# Patient Record
Sex: Male | Born: 1965
Health system: Southern US, Community
[De-identification: ages and names within clinical notes are randomized; demographics above are authoritative.]

## PROBLEM LIST (undated history)

## (undated) ENCOUNTER — Ambulatory Visit (HOSPITAL_COMMUNITY): Admission: EM | Payer: Medicare HMO

## (undated) DIAGNOSIS — L309 Dermatitis, unspecified: Secondary | ICD-10-CM

## (undated) DIAGNOSIS — M109 Gout, unspecified: Secondary | ICD-10-CM

---

## 1997-12-22 ENCOUNTER — Emergency Department (HOSPITAL_COMMUNITY): Admission: EM | Admit: 1997-12-22 | Discharge: 1997-12-22 | Payer: Self-pay | Admitting: Emergency Medicine

## 1997-12-22 ENCOUNTER — Encounter: Payer: Self-pay | Admitting: Emergency Medicine

## 2002-06-10 ENCOUNTER — Emergency Department (HOSPITAL_COMMUNITY): Admission: EM | Admit: 2002-06-10 | Discharge: 2002-06-10 | Payer: Self-pay | Admitting: Emergency Medicine

## 2004-01-27 ENCOUNTER — Ambulatory Visit: Payer: Self-pay | Admitting: *Deleted

## 2004-03-09 ENCOUNTER — Ambulatory Visit: Payer: Self-pay | Admitting: Family Medicine

## 2004-03-09 ENCOUNTER — Ambulatory Visit (HOSPITAL_COMMUNITY): Admission: RE | Admit: 2004-03-09 | Discharge: 2004-03-09 | Payer: Self-pay | Admitting: Internal Medicine

## 2004-03-31 ENCOUNTER — Ambulatory Visit: Payer: Self-pay | Admitting: Family Medicine

## 2005-07-22 ENCOUNTER — Ambulatory Visit: Payer: Self-pay | Admitting: Family Medicine

## 2006-11-03 ENCOUNTER — Ambulatory Visit: Payer: Self-pay | Admitting: Internal Medicine

## 2006-11-16 ENCOUNTER — Encounter (INDEPENDENT_AMBULATORY_CARE_PROVIDER_SITE_OTHER): Payer: Self-pay | Admitting: *Deleted

## 2007-12-26 ENCOUNTER — Emergency Department (HOSPITAL_COMMUNITY): Admission: EM | Admit: 2007-12-26 | Discharge: 2007-12-26 | Payer: Self-pay | Admitting: Family Medicine

## 2008-07-24 ENCOUNTER — Ambulatory Visit: Payer: Self-pay | Admitting: Internal Medicine

## 2008-08-28 ENCOUNTER — Ambulatory Visit: Payer: Self-pay | Admitting: Internal Medicine

## 2008-11-26 ENCOUNTER — Telehealth (INDEPENDENT_AMBULATORY_CARE_PROVIDER_SITE_OTHER): Payer: Self-pay | Admitting: *Deleted

## 2008-12-13 ENCOUNTER — Telehealth (INDEPENDENT_AMBULATORY_CARE_PROVIDER_SITE_OTHER): Payer: Self-pay | Admitting: *Deleted

## 2008-12-16 ENCOUNTER — Ambulatory Visit: Payer: Self-pay | Admitting: Adult Health

## 2009-05-02 ENCOUNTER — Ambulatory Visit: Payer: Self-pay | Admitting: Internal Medicine

## 2009-08-12 ENCOUNTER — Emergency Department (HOSPITAL_COMMUNITY): Admission: EM | Admit: 2009-08-12 | Discharge: 2009-08-12 | Payer: Self-pay | Admitting: Emergency Medicine

## 2009-10-19 ENCOUNTER — Emergency Department (HOSPITAL_COMMUNITY): Admission: EM | Admit: 2009-10-19 | Discharge: 2009-10-19 | Payer: Self-pay | Admitting: Emergency Medicine

## 2010-05-15 LAB — DIFFERENTIAL
Basophils Absolute: 0 10*3/uL (ref 0.0–0.1)
Basophils Relative: 0 % (ref 0–1)
Eosinophils Relative: 2 % (ref 0–5)
Monocytes Absolute: 0.8 10*3/uL (ref 0.1–1.0)
Monocytes Relative: 8 % (ref 3–12)

## 2010-05-15 LAB — CBC
HCT: 39.6 % (ref 39.0–52.0)
Hemoglobin: 13.8 g/dL (ref 13.0–17.0)
MCH: 32.9 pg (ref 26.0–34.0)
MCHC: 34.8 g/dL (ref 30.0–36.0)
MCV: 94.5 fL (ref 78.0–100.0)
RDW: 14 % (ref 11.5–15.5)

## 2010-05-15 LAB — BASIC METABOLIC PANEL
BUN: 16 mg/dL (ref 6–23)
CO2: 24 mEq/L (ref 19–32)
Calcium: 9 mg/dL (ref 8.4–10.5)
Chloride: 107 mEq/L (ref 96–112)
Creatinine, Ser: 1.03 mg/dL (ref 0.4–1.5)
GFR calc Af Amer: >60 mL/min (ref >60)
GFR calc non Af Amer: >60 mL/min (ref >60)
Glucose, Bld: 139 mg/dL — ABNORMAL HIGH (ref 70–99)
Potassium: 3.8 mEq/L (ref 3.5–5.1)
Sodium: 138 mEq/L (ref 135–145)

## 2010-05-16 ENCOUNTER — Encounter (INDEPENDENT_AMBULATORY_CARE_PROVIDER_SITE_OTHER): Payer: Self-pay | Admitting: *Deleted

## 2010-05-16 LAB — CONVERTED CEMR LAB
Albumin: 4.2 g/dL (ref 3.5–5.2)
Alkaline Phosphatase: 91 units/L (ref 39–117)
BUN: 12 mg/dL (ref 6–23)
CO2: 23 meq/L (ref 19–32)
Glucose, Bld: 86 mg/dL (ref 70–99)
Potassium: 3.8 meq/L (ref 3.5–5.3)
Sodium: 138 meq/L (ref 135–145)
Total Bilirubin: 0.4 mg/dL (ref 0.3–1.2)
Total Protein: 7.3 g/dL (ref 6.0–8.3)

## 2010-05-18 LAB — URINALYSIS, ROUTINE W REFLEX MICROSCOPIC
Bilirubin Urine: NEGATIVE
Glucose, UA: NEGATIVE mg/dL
Hgb urine dipstick: NEGATIVE
Protein, ur: NEGATIVE mg/dL
Urobilinogen, UA: 0.2 mg/dL (ref 0.0–1.0)

## 2011-11-20 ENCOUNTER — Encounter (HOSPITAL_COMMUNITY): Payer: Self-pay | Admitting: Emergency Medicine

## 2011-11-20 ENCOUNTER — Emergency Department (HOSPITAL_COMMUNITY)
Admission: EM | Admit: 2011-11-20 | Discharge: 2011-11-20 | Disposition: A | Discharge status: Home / Self Care | Payer: Self-pay | Source: Home / Self Care

## 2011-11-20 DIAGNOSIS — M109 Gout, unspecified: Secondary | ICD-10-CM

## 2011-11-20 HISTORY — DX: Dermatitis, unspecified: L30.9

## 2011-11-20 HISTORY — DX: Gout, unspecified: M10.9

## 2011-11-20 MED ORDER — TRIAMCINOLONE ACETONIDE 40 MG/ML IJ SUSP
INTRAMUSCULAR | Status: AC
  Filled 2011-11-20: qty 5

## 2011-11-20 MED ORDER — COLCHICINE 0.6 MG PO TABS: ORAL_TABLET | ORAL | Status: DC

## 2011-11-20 MED ORDER — TRIAMCINOLONE ACETONIDE 40 MG/ML IJ SUSP
60.0000 mg | Freq: Once | INTRAMUSCULAR | Status: AC
  Administered 2011-11-20: 60 mg via INTRAMUSCULAR

## 2011-11-20 MED ORDER — NAPROXEN 500 MG PO TABS: 500.0000 mg | ORAL_TABLET | Freq: Two times a day (BID) | ORAL | Status: DC

## 2011-11-20 NOTE — ED Notes (Signed)
Pt c/o gout on right knee x3 weeks... Sx include: pain, swelling, unable to bend at knee... Denies: fevers, vomiting, nausea, diarrhea... Also, has some gout pain on right toe and left toe.

## 2011-11-20 NOTE — ED Provider Notes (Signed)
History     CSN: [id number]  Arrival date & time 11/20/11  1402   None     Chief Complaint  Patient presents with  . Gout    (Consider location/radiation/quality/duration/timing/severity/associated sxs/prior treatment) HPI Comments: This is a 46 year old man with a history of gout previously exacerbated alcohol consumption. He states he stopped drinking on [birth year] which [birth year] his [birth year]. This most recent flareup occurred 2 and half weeks ago and is complaining of pain and inflammation in the right great toe, the right knee, and a" little bit in the left foot".  during the time of his pain he has been working at a [employer] site performing gaining squatting and other movements exacerbate the pain. One day recently after work he states his right knee just locked and he has sick neck pain with attempts to flex the knee. He was taken off daily call [employer] recently however he states that's what worse for him to best.   Past Medical History  Diagnosis Date  . Gout   . Eczema     History reviewed. No pertinent past surgical history.  No family history on file.  History  Substance Use Topics  . Smoking status: Current Every Day Smoker -- 1.0 packs/day    Types: Cigarettes  . Smokeless tobacco: Not on file  . Alcohol Use: Yes      Review of Systems  Constitutional: Negative.  Negative for fever, chills and diaphoresis.  Respiratory: Negative.   Gastrointestinal: Negative.   Genitourinary: Negative.   Musculoskeletal:       As per HPI  Skin: Negative.   Neurological: Negative for dizziness, weakness, numbness and headaches.    Allergies  Review of patient's allergies indicates no known allergies.  Home Medications   Current Outpatient Rx  Name Route Sig Dispense Refill  . COLCHICINE 0.6 MG PO TABS  2 tabs po x 1, then one tab po 1 hour later 8 tablet 0  . NAPROXEN 500 MG PO TABS Oral Take 1 tablet (500 mg total) by mouth 2 (two) times daily. Take with food 30  tablet 0    BP 168/102  Pulse 98  Temp 98.6 F (37 C) (Oral)  Resp 18  SpO2 96%  Physical Exam  Constitutional: He is oriented to person, place, and time. He appears well-developed and well-nourished.  HENT:  Head: Normocephalic and atraumatic.  Eyes: EOM are normal. Left eye exhibits no discharge.  Neck: Normal range of motion. Neck supple.  Musculoskeletal:       Marked tenderness over the right patella and knee. The most anterior aspect experienced this severe pain with light touch. There is also pain and tenderness in the right great toe however he refuses to take the shoe off because of the pain it causes. No erythema or lymphangitis, no increased warmth or deformity.  Neurological: He is alert and oriented to person, place, and time. No cranial nerve deficit.  Skin: Skin is warm and dry. No rash noted. No erythema.  Psychiatric: He has a normal mood and affect.    ED Course  Procedures (including critical care time)  Labs Reviewed - No data to display No results found.   1. Gout attack       MDM  Naprosyn 500 mg twice a day p.c.  Colcrys .6mg  2 now, repeat in 2 h, then 1 q d for 5 d. Decrease activity and weight bearing for a few days.        Hayden Rasmussen,  NP 11/20/11 1545

## 2011-11-21 NOTE — ED Provider Notes (Signed)
Medical screening examination/treatment/procedure(s) were performed by non-physician practitioner and as supervising physician I was immediately available for consultation/collaboration.  Leslee Home, M.D.   Reuben Likes, MD 11/21/11 [phone]

## 2012-01-26 ENCOUNTER — Encounter (HOSPITAL_COMMUNITY): Payer: Self-pay | Admitting: *Deleted

## 2012-01-26 ENCOUNTER — Emergency Department (HOSPITAL_COMMUNITY)
Admission: EM | Admit: 2012-01-26 | Discharge: 2012-01-26 | Disposition: A | Discharge status: Home / Self Care | Payer: Self-pay | Attending: Emergency Medicine | Admitting: Emergency Medicine

## 2012-01-26 DIAGNOSIS — M109 Gout, unspecified: Secondary | ICD-10-CM

## 2012-01-26 DIAGNOSIS — F17200 Nicotine dependence, unspecified, uncomplicated: Secondary | ICD-10-CM | POA: Insufficient documentation

## 2012-01-26 DIAGNOSIS — Z791 Long term (current) use of non-steroidal anti-inflammatories (NSAID): Secondary | ICD-10-CM | POA: Insufficient documentation

## 2012-01-26 DIAGNOSIS — Z79899 Other long term (current) drug therapy: Secondary | ICD-10-CM | POA: Insufficient documentation

## 2012-01-26 MED ORDER — HYDROCODONE-ACETAMINOPHEN 5-325 MG PO TABS
2.0000 | ORAL_TABLET | Freq: Once | ORAL | Status: AC
  Administered 2012-01-26: 2 via ORAL
  Filled 2012-01-26: qty 2

## 2012-01-26 MED ORDER — PREDNISONE 10 MG PO TABS: 40.0000 mg | ORAL_TABLET | Freq: Every day | ORAL | Status: DC

## 2012-01-26 MED ORDER — PREDNISONE 20 MG PO TABS
60.0000 mg | ORAL_TABLET | Freq: Once | ORAL | Status: AC
  Administered 2012-01-26: 60 mg via ORAL
  Filled 2012-01-26: qty 3

## 2012-01-26 MED ORDER — HYDROCODONE-ACETAMINOPHEN 5-500 MG PO TABS: 1.0000 | ORAL_TABLET | Freq: Four times a day (QID) | ORAL | Status: DC | PRN

## 2012-01-26 NOTE — ED Notes (Signed)
To ED for eval of left elbow pain. States he has known gout and was seen at [facility] 2 weeks ago. Ran out of meds. No injury noted.

## 2012-01-26 NOTE — ED Notes (Signed)
Patient states he only had 8 pills that were prescribed from [facility] apprx a month ago. He states his elbow flared up 3-4 days ago and has no meds or physician.

## 2012-01-26 NOTE — ED Provider Notes (Signed)
History   This chart was scribed for Doug Sou, MD by Toya Smothers, ED Scribe. The patient was seen in room TR11C/TR11C. Patient's care was started at 1318.  CSN: [id number]  Arrival date & time 01/26/12  1318   First MD Initiated Contact with Patient 01/26/12 1418      Chief Complaint  Patient presents with  . Elbow Pain    The history is provided by the patient. No language interpreter was used.    Bryce West is a 46 y.o. male with H/o gout who presents to the Emergency Department complaining of 3-4 days of left elbow pain and swelling. Pain is described similar to that of previous gout flare ups. Aggravated with palpation and extension of LUE. He reports onset 1 week after consumption of alcohol. Pt lists a 16 year h/o of recurrent gout flare ups, which are typically treated with Rx naoproxen 500 mg and Colchicine 0.6 MG. Has also been treated with prednisone in the past with success No fever, chills, cough, congestion, rhinorrhea, chest pain, SOB, or n/v/d. Symptoms have not been treated PTA. Medical Hx includes gout and eczema. No pertinent surgical Hx listed. Pt denies use of tobacco products and use of illicit drugs. Alcohol last consumed 3 weeks ago.   Past Medical History  Diagnosis Date  . Gout   . Eczema     History reviewed. No pertinent past surgical history.  History reviewed. No pertinent family history.  History  Substance Use Topics  . Smoking status: Current Every Day Smoker -- 1.0 packs/day    Types: Cigarettes  . Smokeless tobacco: Not on file  . Alcohol Use: Yes    Review of Systems  Constitutional: Negative.   HENT: Negative.   Respiratory: Negative.   Cardiovascular: Negative.   Gastrointestinal: Negative.   Musculoskeletal: Positive for joint swelling.  Skin: Negative.   Neurological: Negative.   Hematological: Negative.   Psychiatric/Behavioral: Negative.     Allergies  Review of patient's allergies indicates no known  allergies.  Home Medications   Current Outpatient Rx  Name  Route  Sig  Dispense  Refill  . COLCHICINE 0.6 MG PO TABS      2 tabs po x 1, then one tab po 1 hour later   8 tablet   0   . NAPROXEN 500 MG PO TABS   Oral   Take 1 tablet (500 mg total) by mouth 2 (two) times daily. Take with food   30 tablet   0     BP 154/91  Pulse 88  Temp 98.1 F (36.7 C) (Oral)  Resp 14  SpO2 97%  Physical Exam  Nursing note and vitals reviewed. Constitutional: He appears well-developed and well-nourished.  HENT:  Head: Normocephalic and atraumatic.  Eyes: Conjunctivae normal are normal. Pupils are equal, round, and reactive to light.  Neck: Neck supple. No tracheal deviation present. No thyromegaly present.  Cardiovascular: Normal rate and regular rhythm.   No murmur heard.       Radial pulse is 2+.  Pulmonary/Chest: Effort normal and breath sounds normal.  Abdominal: Soft. Bowel sounds are normal. He exhibits no distension. There is no tenderness.  Musculoskeletal: Normal range of motion. He exhibits no edema and no tenderness.       Unable to fully extend LUE. Elbow is slightly swollen and warm.  Neurological: He is alert. Coordination normal.  Skin: Skin is warm and dry. No rash noted.  Psychiatric: He has a normal mood and affect.  ED Course  Procedures DIAGNOSTIC STUDIES: Oxygen Saturation is 97% on room air, normal by my interpretation.    COORDINATION OF CARE: 14:19- Evaluated Pt. Pt is awake, alert, and without distress. Pt arrived via personal transport and will be driven home. 14:29- Patient informed of clinical course, understand medical decision-making process, and agree with plan.    Labs Reviewed - No data to display No results found.   No diagnosis found.    MDM  Plan prescriptions prednisone, Vicodin Followup [place] [facility] Blood pressure recheck 3 weeks Diagnosis #1 gouty arthropathy #2 elevated blood pressure       I  personally performed the services described in this documentation, which was scribed in my presence. The recorded information has been reviewed and is accurate.    Doug Sou, MD 01/26/12 [phone]

## 2012-03-15 ENCOUNTER — Encounter (HOSPITAL_COMMUNITY): Payer: Self-pay | Admitting: *Deleted

## 2012-03-15 ENCOUNTER — Emergency Department (INDEPENDENT_AMBULATORY_CARE_PROVIDER_SITE_OTHER)
Admission: EM | Admit: 2012-03-15 | Discharge: 2012-03-15 | Disposition: A | Discharge status: Home / Self Care | Payer: Self-pay | Source: Home / Self Care | Attending: Family Medicine | Admitting: Family Medicine

## 2012-03-15 DIAGNOSIS — M109 Gout, unspecified: Secondary | ICD-10-CM

## 2012-03-15 LAB — COMPREHENSIVE METABOLIC PANEL
ALT: 7 U/L (ref 0–53)
AST: 15 U/L (ref 0–37)
Albumin: 3.9 g/dL (ref 3.5–5.2)
Alkaline Phosphatase: 113 U/L (ref 39–117)
CO2: 26 mEq/L (ref 19–32)
Chloride: 100 mEq/L (ref 96–112)
GFR calc non Af Amer: >90 mL/min (ref >90)
Potassium: 3.7 mEq/L (ref 3.5–5.1)
Total Bilirubin: 0.4 mg/dL (ref 0.3–1.2)

## 2012-03-15 LAB — CBC
Hemoglobin: 14.9 g/dL (ref 13.0–17.0)
Platelets: 241 10*3/uL (ref 150–400)
RBC: 4.86 MIL/uL (ref 4.22–5.81)
WBC: 11.4 10*3/uL — ABNORMAL HIGH (ref 4.0–10.5)

## 2012-03-15 LAB — URIC ACID: Uric Acid, Serum: 6.8 mg/dL (ref 4.0–7.8)

## 2012-03-15 MED ORDER — PREDNISONE 20 MG PO TABS: 40.0000 mg | ORAL_TABLET | Freq: Every day | ORAL | Status: DC

## 2012-03-15 MED ORDER — HYDROCODONE-ACETAMINOPHEN 5-500 MG PO TABS: 1.0000 | ORAL_TABLET | Freq: Four times a day (QID) | ORAL | Status: DC | PRN

## 2012-03-15 MED ORDER — NAPROXEN 500 MG PO TABS: 500.0000 mg | ORAL_TABLET | Freq: Two times a day (BID) | ORAL | Status: DC

## 2012-03-15 NOTE — ED Notes (Signed)
Pt reports gout in right wrist - has run out of meds - pt reports no etoh for 100 days and then drank for 3 days= gout returning

## 2012-03-15 NOTE — ED Provider Notes (Signed)
History     CSN: [id number]  Arrival date & time 03/15/12  1126   First MD Initiated Contact with Patient 03/15/12 1152      Chief Complaint  Patient presents with  . Gout    (Consider location/radiation/quality/duration/timing/severity/associated sxs/prior treatment) HPI Comments: 47 year old male smoker with history of gout. Here complaining of right hand and right wrist pain for the last 3 days. Patient reports that he has been abstinent from alcohol for about 3 months but has been drinking in the last week which is making his gout flare up. States he's been off alcohol for the last 3 days since his pain started. Denies headaches, tremors or hallucinations. Denies shortness of breath, sweats or chest pain.  Patient states he ran out of gout medications and is currently without insurance. He used to be a [facility] patient [facility] the clinic is closed. He is not able to afford colchicine. Denies injury to his right upper extremity. Denies recent falls. Patient reports that usually his gout flares involved the right hand and right wrist and the left foot. Denies foot pain today. Has not had blood work in our records since 2011.   Past Medical History  Diagnosis Date  . Gout   . Eczema     No past surgical history on file.  Family History  Problem Relation Age of Onset  . Family history unknown: Yes    History  Substance Use Topics  . Smoking status: Current Every Day Smoker -- 1.0 packs/day    Types: Cigarettes  . Smokeless tobacco: Not on file  . Alcohol Use: Yes      Review of Systems  Constitutional: Negative for fever and chills.  Eyes: Negative for redness.  Respiratory: Negative for shortness of breath.   Cardiovascular: Negative for chest pain.  Gastrointestinal: Negative for nausea, vomiting and abdominal pain.  Genitourinary: Negative for dysuria.  Musculoskeletal: Positive for arthralgias.       As per history of present illness  Skin: Negative for rash.    Neurological: Negative for dizziness and headaches.  All other systems reviewed and are negative.    Allergies  Review of patient's allergies indicates no known allergies.  Home Medications   Current Outpatient Rx  Name  Route  Sig  Dispense  Refill  . COLCHICINE 0.6 MG PO TABS      2 tabs po x 1, then one tab po 1 hour later   8 tablet   0   . HYDROCODONE-ACETAMINOPHEN 5-500 MG PO TABS   Oral   Take 1-2 tablets by mouth every 6 (six) hours as needed for pain.   15 tablet   0   . NAPROXEN 500 MG PO TABS   Oral   Take 1 tablet (500 mg total) by mouth 2 (two) times daily with a meal. Take with food   30 tablet   1   . PREDNISONE 20 MG PO TABS   Oral   Take 2 tablets (40 mg total) by mouth daily.   10 tablet   0     BP 144/90  Pulse 81  Temp 98.2 F (36.8 C)  Resp 16  SpO2 98%  Physical Exam  Nursing note and vitals reviewed. Constitutional: He is oriented to person, place, and time. He appears well-developed and well-nourished. No distress.  Eyes: Conjunctivae normal are normal. No scleral icterus.  Neck: No JVD present. No thyromegaly present.  Cardiovascular: Normal rate, regular rhythm and normal heart sounds.  Pulmonary/Chest: Breath sounds normal.  Musculoskeletal:       Wrist right: limited flexion and extension due to pain no obvious swelling or deformity. No erythema or increased temp. There is noted mild swelling and tenderness to palpation with no redness or increased temp at the second and third MP joint also in the right hand. No skin brakes or wounds.  Normal radial and ulnar pulses. Intact superficial sensation in right distal upper extremity.  Lymphadenopathy:    He has no cervical adenopathy.  Neurological: He is alert and oriented to person, place, and time.  Skin: No rash noted. He is not diaphoretic.    ED Course  Procedures (including critical care time)   Labs Reviewed  CBC  URIC ACID  COMPREHENSIVE METABOLIC PANEL   No  results found.   1. Gout attack       MDM  Prescribed prednisone, naproxen and Vicodin. Patient unable to afford colchicine. Checked CBC, uric acid and complete metabolic panel. Patient was asked to scheduled a followup appointment at the adult clinic in one or 2 weeks on his way out. Supportive care recommendations should prompt his return to medical attention discussed with patient and provided in writing.        Sharin Grave, MD 03/17/12 1036

## 2013-04-15 ENCOUNTER — Emergency Department (HOSPITAL_COMMUNITY)
Admission: EM | Admit: 2013-04-15 | Discharge: 2013-04-15 | Disposition: A | Discharge status: Home / Self Care | Payer: Self-pay | Attending: Emergency Medicine | Admitting: Emergency Medicine

## 2013-04-15 ENCOUNTER — Encounter (HOSPITAL_COMMUNITY): Payer: Self-pay | Admitting: Emergency Medicine

## 2013-04-15 DIAGNOSIS — F17200 Nicotine dependence, unspecified, uncomplicated: Secondary | ICD-10-CM | POA: Insufficient documentation

## 2013-04-15 DIAGNOSIS — Z791 Long term (current) use of non-steroidal anti-inflammatories (NSAID): Secondary | ICD-10-CM | POA: Insufficient documentation

## 2013-04-15 DIAGNOSIS — L259 Unspecified contact dermatitis, unspecified cause: Secondary | ICD-10-CM | POA: Insufficient documentation

## 2013-04-15 DIAGNOSIS — F1010 Alcohol abuse, uncomplicated: Secondary | ICD-10-CM | POA: Insufficient documentation

## 2013-04-15 DIAGNOSIS — I10 Essential (primary) hypertension: Secondary | ICD-10-CM | POA: Insufficient documentation

## 2013-04-15 DIAGNOSIS — M109 Gout, unspecified: Secondary | ICD-10-CM | POA: Insufficient documentation

## 2013-04-15 MED ORDER — OXYCODONE-ACETAMINOPHEN 5-325 MG PO TABS
1.0000 | ORAL_TABLET | Freq: Once | ORAL | Status: AC
  Administered 2013-04-15: 1 via ORAL
  Filled 2013-04-15: qty 1

## 2013-04-15 MED ORDER — NAPROXEN 250 MG PO TABS
500.0000 mg | ORAL_TABLET | Freq: Two times a day (BID) | ORAL | Status: DC
  Administered 2013-04-15: 500 mg via ORAL
  Filled 2013-04-15: qty 2

## 2013-04-15 MED ORDER — NAPROXEN 500 MG PO TABS: 500.0000 mg | ORAL_TABLET | Freq: Two times a day (BID) | ORAL | Status: DC

## 2013-04-15 MED ORDER — OXYCODONE-ACETAMINOPHEN 5-325 MG PO TABS: 1.0000 | ORAL_TABLET | Freq: Four times a day (QID) | ORAL | Status: DC | PRN

## 2013-04-15 NOTE — ED Provider Notes (Signed)
CSN: [id number]     Arrival date & time 04/15/13  1030 History   First MD Initiated Contact with Patient 04/15/13 1102     Chief Complaint  Patient presents with  . Gout    right hand     (Consider location/radiation/quality/duration/timing/severity/associated sxs/prior Treatment) HPI  This is a 48 year old male with history of gout who presents with right hand pain. Patient reports a one-week history of right hand pain and swelling. He states that he has a history of gout and it usually migrates to involve his ankles, knees, or his hands. He is out of medications.  Reports 10/10 pain and has not taken anything for the pain. He does not take any chronic medications including allopurinol.  Admits to drinking alcohol and eating red meat. Patient denies any fevers or systemic symptoms. Of note, patient noted to be hypertensive in triage at 184/99. He denies any headache, chest pain, shortness of breath. He states he does have a primary clinic that he can go to.  Past Medical History  Diagnosis Date  . Gout   . Eczema    History reviewed. No pertinent past surgical history. No family history on file. History  Substance Use Topics  . Smoking status: Current Every Day Smoker -- 1.00 packs/day    Types: Cigarettes  . Smokeless tobacco: Not on file  . Alcohol Use: Yes    Review of Systems  Constitutional: Negative.  Negative for fever.  Respiratory: Negative.  Negative for chest tightness and shortness of breath.   Cardiovascular: Negative.  Negative for chest pain.  Gastrointestinal: Negative.  Negative for abdominal pain.  Genitourinary: Negative.  Negative for dysuria.  Musculoskeletal: Positive for joint swelling. Negative for back pain.  Skin: Negative for color change.  Neurological: Negative for headaches.  All other systems reviewed and are negative.      Allergies  Vicodin  Home Medications   Current Outpatient Rx  Name  Route  Sig  Dispense  Refill  . calamine  lotion   Topical   Apply 1 application topically 2 (two) times daily as needed (Eczema on hands).         . naproxen sodium (ANAPROX) 220 MG tablet   Oral   Take 220 mg by mouth daily as needed (Pain).         Marland Kitchen. OVER THE COUNTER MEDICATION   Oral   Take 1-2 tablets by mouth daily as needed (Pain relief). Generic Pain Relief medication         . colchicine 0.6 MG tablet      2 tabs po x 1, then one tab po 1 hour later   8 tablet   0   . naproxen (NAPROSYN) 500 MG tablet   Oral   Take 1 tablet (500 mg total) by mouth 2 (two) times daily with a meal.   30 tablet   0   . oxyCODONE-acetaminophen (PERCOCET/ROXICET) 5-325 MG per tablet   Oral   Take 1 tablet by mouth every 6 (six) hours as needed for severe pain.   15 tablet   0    BP 174/93  Pulse 69  Temp(Src) 98.4 F (36.9 C) (Oral)  Resp 18  Ht 5\' 11"  (1.803 m)  Wt 146 lb 8 oz (66.452 kg)  BMI 20.44 kg/m2  SpO2 100% Physical Exam  Nursing note and vitals reviewed. Constitutional: He is oriented to person, place, and time. He appears well-developed and well-nourished. No distress.  HENT:  Head: Normocephalic and  atraumatic.  Mouth/Throat: Oropharynx is clear and moist.  Cardiovascular: Normal rate, regular rhythm and normal heart sounds.   No murmur heard. Pulmonary/Chest: Effort normal. No respiratory distress.  Abdominal: Soft. There is no tenderness.  Musculoskeletal:  Swelling and tenderness to palpation over the right second MCP joint, no significant erythema or warmth noted  Lymphadenopathy:    He has no cervical adenopathy.  Neurological: He is alert and oriented to person, place, and time.  Skin: Skin is warm and dry.  Psychiatric: He has a normal mood and affect.    ED Course  Procedures (including critical care time) Labs Review Labs Reviewed - No data to display Imaging Review No results found.  EKG Interpretation   None       MDM   Final diagnoses:  Gout  Hypertension     Patient presents with right hand pain consistent with his gout.  He is otherwise nontoxic-appearing. He was noted to be hypertensive.  He has no other complaints. Physical exam reveals tenderness to palpation of the right MCP joint, no overlying skin changes or erythema noted. Spaces appear systemically well and have low suspicion at this time for septic joint. Patient will be given naproxen and oxycodone. I reviewed patient's lab work from January 2015 which showed a normal creatinine. Patient will be referred back to his clinic for further blood pressure checks.  After history, exam, and medical workup I feel the patient has been appropriately medically screened and is safe for discharge home. Pertinent diagnoses were discussed with the patient. Patient was given return precautions.     Shon Baton, MD 04/15/13 [phone]

## 2013-04-15 NOTE — Discharge Instructions (Signed)
Gout °Gout is an inflammatory arthritis caused by a buildup of uric acid crystals in the joints. Uric acid is a chemical that is normally present in the blood. When the level of uric acid in the blood is too high it can form crystals that deposit in your joints and tissues. This causes joint redness, soreness, and swelling (inflammation). Repeat attacks are common. Over time, uric acid crystals can form into masses (tophi) near a joint, destroying bone and causing disfigurement. Gout is treatable and often preventable. °CAUSES  °The disease begins with elevated levels of uric acid in the blood. Uric acid is produced by your body when it breaks down a naturally found substance called purines. Certain foods you eat, such as meats and fish, contain high amounts of purines. Causes of an elevated uric acid level include: °· Being passed down from parent to child (heredity). °· Diseases that cause increased uric acid production (such as obesity, psoriasis, and certain cancers). °· Excessive alcohol use. °· Diet, especially diets rich in meat and seafood. °· Medicines, including certain cancer-fighting medicines (chemotherapy), water pills (diuretics), and aspirin. °· Chronic kidney disease. The kidneys are no longer able to remove uric acid well. °· Problems with metabolism. °Conditions strongly associated with gout include: °· Obesity. °· High blood pressure. °· High cholesterol. °· Diabetes. °Not everyone with elevated uric acid levels gets gout. It is not understood why some people get gout and others do not. Surgery, joint injury, and eating too much of certain foods are some of the factors that can lead to gout attacks. °SYMPTOMS  °· An attack of gout comes on quickly. It causes intense pain with redness, swelling, and warmth in a joint. °· Fever can occur. °· Often, only one joint is involved. Certain joints are more commonly involved: °· Base of the big toe. °· Knee. °· Ankle. °· Wrist. °· Finger. °Without  treatment, an attack usually goes away in a few days to weeks. Between attacks, you usually will not have symptoms, which is different from many other forms of arthritis. °DIAGNOSIS  °Your caregiver will suspect gout based on your symptoms and exam. In some cases, tests may be recommended. The tests may include: °· Blood tests. °· Urine tests. °· X-rays. °· Joint fluid exam. This exam requires a needle to remove fluid from the joint (arthrocentesis). Using a microscope, gout is confirmed when uric acid crystals are seen in the joint fluid. °TREATMENT  °There are two phases to gout treatment: treating the sudden onset (acute) attack and preventing attacks (prophylaxis). °· Treatment of an Acute Attack. °· Medicines are used. These include anti-inflammatory medicines or steroid medicines. °· An injection of steroid medicine into the affected joint is sometimes necessary. °· The painful joint is rested. Movement can worsen the arthritis. °· You may use warm or cold treatments on painful joints, depending which works best for you. °· Treatment to Prevent Attacks. °· If you suffer from frequent gout attacks, your caregiver may advise preventive medicine. These medicines are started after the acute attack subsides. These medicines either help your kidneys eliminate uric acid from your body or decrease your uric acid production. You may need to stay on these medicines for a very long time. °· The early phase of treatment with preventive medicine can be associated with an increase in acute gout attacks. For this reason, during the first few months of treatment, your caregiver may also advise you to take medicines usually used for acute gout treatment. Be sure you   understand your caregiver's directions. Your caregiver may make several adjustments to your medicine dose before these medicines are effective.  Discuss dietary treatment with your caregiver or dietitian. Alcohol and drinks high in sugar and fructose and foods  such as meat, poultry, and seafood can increase uric acid levels. Your caregiver or dietician can advise you on drinks and foods that should be limited. HOME CARE INSTRUCTIONS   Do not take aspirin to relieve pain. This raises uric acid levels.  Only take over-the-counter or prescription medicines for pain, discomfort, or fever as directed by your caregiver.  Rest the joint as much as possible. When in bed, keep sheets and blankets off painful areas.  Keep the affected joint raised (elevated).  Apply warm or cold treatments to painful joints. Use of warm or cold treatments depends on which works best for you.  Use crutches if the painful joint is in your leg.  Drink enough fluids to keep your urine clear or pale yellow. This helps your body get rid of uric acid. Limit alcohol, sugary drinks, and fructose drinks.  Follow your dietary instructions. Pay careful attention to the amount of protein you eat. Your daily diet should emphasize fruits, vegetables, whole grains, and fat-free or low-fat milk products. Discuss the use of coffee, vitamin C, and cherries with your caregiver or dietician. These may be helpful in lowering uric acid levels.  Maintain a healthy body weight. SEEK MEDICAL CARE IF:   You develop diarrhea, vomiting, or any side effects from medicines.  You do not feel better in 24 hours, or you are getting worse. SEEK IMMEDIATE MEDICAL CARE IF:   Your joint becomes suddenly more tender, and you have chills or a fever. MAKE SURE YOU:   Understand these instructions.  Will watch your condition.  Will get help right away if you are not doing well or get worse. Document Released: 02/13/2000 Document Revised: 06/12/2012 Document Reviewed: 09/29/2011 [facility] Patient Information 2014 [place], [place].  Arterial Hypertension Arterial hypertension (high blood pressure) is a condition of elevated pressure in your blood vessels. Hypertension over a long period of time is a risk  factor for strokes, heart attacks, and heart failure. It is also the leading cause of kidney (renal) failure.  CAUSES   In Adults -- Over 90% of all hypertension has no known cause. This is called essential or primary hypertension. In the other 10% of people with hypertension, the increase in blood pressure is caused by another disorder. This is called secondary hypertension. Important causes of secondary hypertension are:  Heavy alcohol use.  Obstructive sleep apnea.  Hyperaldosterosim (Conn's syndrome).  Steroid use.  Chronic kidney failure.  Hyperparathyroidism.  Medications.  Renal artery stenosis.  Pheochromocytoma.  Cushing's disease.  Coarctation of the aorta.  Scleroderma renal crisis.  Licorice (in excessive amounts).  Drugs (cocaine, methamphetamine). Your caregiver can explain any items above that apply to you.  In Children -- Secondary hypertension is more common and should always be considered.  Pregnancy -- Few women of childbearing age have high blood pressure. However, up to 10% of them develop hypertension of pregnancy. Generally, this will not harm the woman. It may be a sign of 3 complications of pregnancy: preeclampsia, HELLP syndrome, and eclampsia. Follow up and control with medication is necessary. SYMPTOMS   This condition normally does not produce any noticeable symptoms. It is usually found during a routine exam.  Malignant hypertension is a late problem of high blood pressure. It may have the following symptoms:  Headaches.  Blurred vision.  End-organ damage (this means your kidneys, heart, lungs, and other organs are being damaged).  Stressful situations can increase the blood pressure. If a person with normal blood pressure has their blood pressure go up while being seen by their caregiver, this is often termed "white coat hypertension." Its importance is not known. It may be related with eventually developing hypertension or complications  of hypertension.  Hypertension is often confused with mental tension, stress, and anxiety. DIAGNOSIS  The diagnosis is made by 3 separate blood pressure measurements. They are taken at least 1 week apart from each other. If there is organ damage from hypertension, the diagnosis may be made without repeat measurements. Hypertension is usually identified by having blood pressure readings:  Above 140/90 mmHg measured in both arms, at 3 separate times, over a couple weeks.  Over 130/80 mmHg should be considered a risk factor and may require treatment in patients with diabetes. Blood pressure readings over 120/80 mmHg are called "pre-hypertension" even in non-diabetic patients. To get a true blood pressure measurement, use the following guidelines. Be aware of the factors that can alter blood pressure readings.  Take measurements at least 1 hour after caffeine.  Take measurements 30 minutes after smoking and without any stress. This is another reason to quit smoking  it raises your blood pressure.  Use a proper cuff size. Ask your caregiver if you are not sure about your cuff size.  Most home blood pressure cuffs are automatic. They will measure systolic and diastolic pressures. The systolic pressure is the pressure reading at the start of sounds. Diastolic pressure is the pressure at which the sounds disappear. If you are elderly, measure pressures in multiple postures. Try sitting, lying or standing.  Sit at rest for a minimum of 5 minutes before taking measurements.  You should not be on any medications like decongestants. These are found in many cold medications.  Record your blood pressure readings and review them with your caregiver. If you have hypertension:  Your caregiver may do tests to be sure you do not have secondary hypertension (see "causes" above).  Your caregiver may also look for signs of metabolic syndrome. This is also called Syndrome X or Insulin Resistance Syndrome. You  may have this syndrome if you have type 2 diabetes, abdominal obesity, and abnormal blood lipids in addition to hypertension.  Your caregiver will take your medical and family history and perform a physical exam.  Diagnostic tests may include blood tests (for glucose, cholesterol, potassium, and kidney function), a urinalysis, or an EKG. Other tests may also be necessary depending on your condition. PREVENTION  There are important lifestyle issues that you can adopt to reduce your chance of developing hypertension:  Maintain a normal weight.  Limit the amount of salt (sodium) in your diet.  Exercise often.  Limit alcohol intake.  Get enough potassium in your diet. Discuss specific advice with your caregiver.  Follow a DASH diet (dietary approaches to stop hypertension). This diet is rich in fruits, vegetables, and low-fat dairy products, and avoids certain fats. PROGNOSIS  Essential hypertension cannot be cured. Lifestyle changes and medical treatment can lower blood pressure and reduce complications. The prognosis of secondary hypertension depends on the underlying cause. Many people whose hypertension is controlled with medicine or lifestyle changes can live a normal, healthy life.  RISKS AND COMPLICATIONS  While high blood pressure alone is not an illness, it often requires treatment due to its short- and long-term effects  on many organs. Hypertension increases your risk for:  CVAs or strokes (cerebrovascular accident).  Heart failure due to chronically high blood pressure (hypertensive cardiomyopathy).  Heart attack (myocardial infarction).  Damage to the retina (hypertensive retinopathy).  Kidney failure (hypertensive nephropathy). Your caregiver can explain list items above that apply to you. Treatment of hypertension can significantly reduce the risk of complications. TREATMENT   For overweight patients, weight loss and regular exercise are recommended. Physical fitness  lowers blood pressure.  Mild hypertension is usually treated with diet and exercise. A diet rich in fruits and vegetables, fat-free dairy products, and foods low in fat and salt (sodium) can help lower blood pressure. Decreasing salt intake decreases blood pressure in a 1/3 of people.  Stop smoking if you are a smoker. The steps above are highly effective in reducing blood pressure. While these actions are easy to suggest, they are difficult to achieve. Most patients with moderate or severe hypertension end up requiring medications to bring their blood pressure down to a normal level. There are several classes of medications for treatment. Blood pressure pills (antihypertensives) will lower blood pressure by their different actions. Lowering the blood pressure by 10 mmHg may decrease the risk of complications by as much as 25%. The goal of treatment is effective blood pressure control. This will reduce your risk for complications. Your caregiver will help you determine the best treatment for you according to your lifestyle. What is excellent treatment for one person, may not be for you. HOME CARE INSTRUCTIONS   Do not smoke.  Follow the lifestyle changes outlined in the "Prevention" section.  If you are on medications, follow the directions carefully. Blood pressure medications must be taken as prescribed. Skipping doses reduces their benefit. It also puts you at risk for problems.  Follow up with your caregiver, as directed.  If you are asked to monitor your blood pressure at home, follow the guidelines in the "Diagnosis" section above. SEEK MEDICAL CARE IF:   You think you are having medication side effects.  You have recurrent headaches or lightheadedness.  You have swelling in your ankles.  You have trouble with your vision. SEEK IMMEDIATE MEDICAL CARE IF:   You have sudden onset of chest pain or pressure, difficulty breathing, or other symptoms of a heart attack.  You have a  severe headache.  You have symptoms of a stroke (such as sudden weakness, difficulty speaking, difficulty walking). MAKE SURE YOU:   Understand these instructions.  Will watch your condition.  Will get help right away if you are not doing well or get worse. Document Released: 02/15/2005 Document Revised: 05/10/2011 Document Reviewed: 09/15/2006 [facility] Patient Information 2014 [place]

## 2013-04-15 NOTE — ED Notes (Signed)
Pt c/o right hand pain and swelling x 1 week. Pt has history of gout. Pt admits to drinking and eating red meats. Pt BP 184/99. Pt denies history of HTN.

## 2014-07-15 ENCOUNTER — Encounter (HOSPITAL_COMMUNITY): Payer: Self-pay | Admitting: Nurse Practitioner

## 2014-07-15 ENCOUNTER — Emergency Department (HOSPITAL_COMMUNITY)
Admission: EM | Admit: 2014-07-15 | Discharge: 2014-07-15 | Disposition: A | Discharge status: Home / Self Care | Payer: Self-pay | Attending: Emergency Medicine | Admitting: Emergency Medicine

## 2014-07-15 DIAGNOSIS — Z872 Personal history of diseases of the skin and subcutaneous tissue: Secondary | ICD-10-CM | POA: Insufficient documentation

## 2014-07-15 DIAGNOSIS — Z720 Tobacco use: Secondary | ICD-10-CM | POA: Insufficient documentation

## 2014-07-15 DIAGNOSIS — M109 Gout, unspecified: Secondary | ICD-10-CM | POA: Insufficient documentation

## 2014-07-15 DIAGNOSIS — M25562 Pain in left knee: Secondary | ICD-10-CM | POA: Insufficient documentation

## 2014-07-15 MED ORDER — NAPROXEN 250 MG PO TABS
500.0000 mg | ORAL_TABLET | Freq: Once | ORAL | Status: AC
  Administered 2014-07-15: 500 mg via ORAL
  Filled 2014-07-15: qty 2

## 2014-07-15 MED ORDER — COLCHICINE 0.6 MG PO TABS: ORAL_TABLET | ORAL | Status: DC

## 2014-07-15 MED ORDER — NAPROXEN 500 MG PO TABS: 500.0000 mg | ORAL_TABLET | Freq: Two times a day (BID) | ORAL | Status: DC

## 2014-07-15 MED ORDER — OXYCODONE-ACETAMINOPHEN 5-325 MG PO TABS: 1.0000 | ORAL_TABLET | Freq: Four times a day (QID) | ORAL | Status: AC | PRN

## 2014-07-15 NOTE — ED Provider Notes (Signed)
CSN: [id number]     Arrival date & time 07/15/14  1303 History   This chart was scribed for non-physician practitioner, Francee PiccoloJennifer Rutherford Alarie, working with Doug SouSam Jacubowitz, MD by Richarda Overlieichard Holland, ED Scribe. This patient was seen in room TR07C/TR07C and the patient's care was started at 2:44 PM.   Chief Complaint  Patient presents with  . Knee Pain  . Elbow Pain   The history is provided by the patient. No language interpreter was used.   HPI Comments: Bryce West is a 49 y.o. male with a history of gout who presents to the Emergency Department complaining of gradually worsening left knee and left elbow pain that started 2 weeks ago. Pt states that it feels similar to his prior gout flare ups. He reports that these locations are the typical locations he experiences his gout pain. He states that drinking and eating salty foods aggravates his pain. Pt states he is not on gout prevention medications but has taken naproxen 500mg  in the past for flare ups with relief. He denies fever or any tingling sensations.    Past Medical History  Diagnosis Date  . Gout   . Eczema    History reviewed. No pertinent past surgical history. History reviewed. No pertinent family history. History  Substance Use Topics  . Smoking status: Current Every Day Smoker -- 1.00 packs/day    Types: Cigarettes  . Smokeless tobacco: Not on file  . Alcohol Use: Yes    Review of Systems  Constitutional: Negative for fever.  Musculoskeletal: Positive for arthralgias.  All other systems reviewed and are negative.  Allergies  Vicodin  Home Medications   Prior to Admission medications   Medication Sig Start Date End Date Taking? Authorizing Provider  calamine lotion Apply 1 application topically 2 (two) times daily as needed (Eczema on hands).    Historical Provider, MD  colchicine 0.6 MG tablet 2 tabs po x 1, then one tab po 1 hour later 07/15/14   Francee PiccoloJennifer Alilah Mcmeans, PA-C  naproxen (NAPROSYN) 500 MG tablet Take  1 tablet (500 mg total) by mouth 2 (two) times daily with a meal. 07/15/14   Francee PiccoloJennifer Ailin Rochford, PA-C  naproxen sodium (ANAPROX) 220 MG tablet Take 220 mg by mouth daily as needed (Pain).    Historical Provider, MD  OVER THE COUNTER MEDICATION Take 1-2 tablets by mouth daily as needed (Pain relief). Generic Pain Relief medication    Historical Provider, MD  oxyCODONE-acetaminophen (PERCOCET/ROXICET) 5-325 MG per tablet Take 1 tablet by mouth every 6 (six) hours as needed for severe pain. 07/15/14   Karsynn Deweese, PA-C   BP 134/104 mmHg  Pulse 69  Temp(Src) 98.1 F (36.7 C) (Oral)  Resp 16  SpO2 100% Physical Exam  Constitutional: He is oriented to person, place, and time. He appears well-developed and well-nourished. No distress.  HENT:  Head: Normocephalic and atraumatic.  Right Ear: External ear normal.  Left Ear: External ear normal.  Nose: Nose normal.  Mouth/Throat: Oropharynx is clear and moist.  Eyes: Conjunctivae are normal.  Neck: Normal range of motion. Neck supple.  No nuchal rigidity.   Cardiovascular: Normal rate, regular rhythm, normal heart sounds and intact distal pulses.   Pulmonary/Chest: Effort normal and breath sounds normal. No respiratory distress.  Abdominal: Soft.  Musculoskeletal: Normal range of motion.       Left elbow: He exhibits swelling. He exhibits normal range of motion, no effusion, no deformity and no laceration. Tenderness found.       Left  knee: He exhibits swelling. He exhibits normal range of motion, no effusion and no deformity. Tenderness found.  No erythema or warmth to left elbow or left knee.   Neurological: He is alert and oriented to person, place, and time.  Skin: Skin is warm and dry. He is not diaphoretic.  Psychiatric: He has a normal mood and affect.  Nursing note and vitals reviewed.   ED Course  Procedures   Medications  naproxen (NAPROSYN) tablet 500 mg (500 mg Oral Given 07/15/14 1512)    DIAGNOSTIC STUDIES: Oxygen  Saturation is 99% on RA, normal by my interpretation.    COORDINATION OF CARE: 2:47 PM Discussed treatment plan with pt at bedside and pt agreed to plan.   Labs Review Labs Reviewed - No data to display  Imaging Review No results found.   EKG Interpretation None      MDM   Final diagnoses:  Acute gout of left elbow, unspecified cause  Acute gout of left knee, unspecified cause   Filed Vitals:   07/15/14 1435  BP: 134/104  Pulse: 69  Temp: 98.1 F (36.7 C)  Resp: 16   Afebrile, NAD, non-toxic appearing, AAOx4.  Patient presents with left elbow and knee pain consistent with his gout. He is otherwise nontoxic-appearing. He was noted to be hypertensive. He has no other complaints. Physical exam reveals tenderness to palpation of the left knee and elbow, no overlying skin changes or erythema noted. Spaces appear systemically well and have low suspicion at this time for septic joint. Patient will be given naproxen, colchicine, and oxycodone. Patient will be referred back to his clinic for further blood pressure checks.   I personally performed the services described in this documentation, which was scribed in my presence. The recorded information has been reviewed and is accurate.       Francee PiccoloJennifer Bryonna Sundby, PA-C 07/15/14 1627  Doug SouSam Jacubowitz, MD 07/15/14 [phone]

## 2014-07-15 NOTE — Discharge Instructions (Signed)
Please follow up with your primary care physician in 1-2 days. If you do not have one please call the [facility] number listed above. Please take pain medication and/or muscle relaxants as prescribed and as needed for pain. Please do not drive on narcotic pain medication or on muscle relaxants. Please read all discharge instructions and return precautions.  ° ° °Gout °Gout is an inflammatory arthritis caused by a buildup of uric acid crystals in the joints. Uric acid is a chemical that is normally present in the blood. When the level of uric acid in the blood is too high it can form crystals that deposit in your joints and tissues. This causes joint redness, soreness, and swelling (inflammation). Repeat attacks are common. Over time, uric acid crystals can form into masses (tophi) near a joint, destroying bone and causing disfigurement. Gout is treatable and often preventable. °CAUSES  °The disease begins with elevated levels of uric acid in the blood. Uric acid is produced by your body when it breaks down a naturally found substance called purines. Certain foods you eat, such as meats and fish, contain high amounts of purines. Causes of an elevated uric acid level include: °· Being passed down from parent to child (heredity). °· Diseases that cause increased uric acid production (such as obesity, psoriasis, and certain cancers). °· Excessive alcohol use. °· Diet, especially diets rich in meat and seafood. °· Medicines, including certain cancer-fighting medicines (chemotherapy), water pills (diuretics), and aspirin. °· Chronic kidney disease. The kidneys are no longer able to remove uric acid well. °· Problems with metabolism. °Conditions strongly associated with gout include: °· Obesity. °· High blood pressure. °· High cholesterol. °· Diabetes. °Not everyone with elevated uric acid levels gets gout. It is not understood why some people get gout and others do not. Surgery, joint injury, and  eating too much of certain foods are some of the factors that can lead to gout attacks. °SYMPTOMS  °· An attack of gout comes on quickly. It causes intense pain with redness, swelling, and warmth in a joint. °· Fever can occur. °· Often, only one joint is involved. Certain joints are more commonly involved: °¨ Base of the big toe. °¨ Knee. °¨ Ankle. °¨ Wrist. °¨ Finger. °Without treatment, an attack usually goes away in a few days to weeks. Between attacks, you usually will not have symptoms, which is different from many other forms of arthritis. °DIAGNOSIS  °Your caregiver will suspect gout based on your symptoms and exam. In some cases, tests may be recommended. The tests may include: °· Blood tests. °· Urine tests. °· X-rays. °· Joint fluid exam. This exam requires a needle to remove fluid from the joint (arthrocentesis). Using a microscope, gout is confirmed when uric acid crystals are seen in the joint fluid. °TREATMENT  °There are two phases to gout treatment: treating the sudden onset (acute) attack and preventing attacks (prophylaxis). °· Treatment of an Acute Attack. °¨ Medicines are used. These include anti-inflammatory medicines or steroid medicines. °¨ An injection of steroid medicine into the affected joint is sometimes necessary. °¨ The painful joint is rested. Movement can worsen the arthritis. °¨ You may use warm or cold treatments on painful joints, depending which works best for you. °· Treatment to Prevent Attacks. °¨ If you suffer from frequent gout attacks, your caregiver may advise preventive medicine. These medicines are started after the acute attack subsides. These medicines either help your kidneys eliminate uric acid from your body or   decrease your uric acid production. You may need to stay on these medicines for a very long time. °¨ The early phase of treatment with preventive medicine can be associated with an increase in acute gout attacks. For this reason, during the first few months  of treatment, your caregiver may also advise you to take medicines usually used for acute gout treatment. Be sure you understand your caregiver's directions. Your caregiver may make several adjustments to your medicine dose before these medicines are effective. °¨ Discuss dietary treatment with your caregiver or dietitian. Alcohol and drinks high in sugar and fructose and foods such as meat, poultry, and seafood can increase uric acid levels. Your caregiver or dietitian can advise you on drinks and foods that should be limited. °HOME CARE INSTRUCTIONS  °· Do not take aspirin to relieve pain. This raises uric acid levels. °· Only take over-the-counter or prescription medicines for pain, discomfort, or fever as directed by your caregiver. °· Rest the joint as much as possible. When in bed, keep sheets and blankets off painful areas. °· Keep the affected joint raised (elevated). °· Apply warm or cold treatments to painful joints. Use of warm or cold treatments depends on which works best for you. °· Use crutches if the painful joint is in your leg. °· Drink enough fluids to keep your urine clear or pale yellow. This helps your body get rid of uric acid. Limit alcohol, sugary drinks, and fructose drinks. °· Follow your dietary instructions. Pay careful attention to the amount of protein you eat. Your daily diet should emphasize fruits, vegetables, whole grains, and fat-free or low-fat milk products. Discuss the use of coffee, vitamin C, and cherries with your caregiver or dietitian. These may be helpful in lowering uric acid levels. °· Maintain a healthy body weight. °SEEK MEDICAL CARE IF:  °· You develop diarrhea, vomiting, or any side effects from medicines. °· You do not feel better in 24 hours, or you are getting worse. °SEEK IMMEDIATE MEDICAL CARE IF:  °· Your joint becomes suddenly more tender, and you have chills or a fever. °MAKE SURE YOU:  °· Understand these instructions. °· Will watch your condition. °· Will  get help right away if you are not doing well or get worse. °Document Released: 02/13/2000 Document Revised: 07/02/2013 Document Reviewed: 09/29/2011 °ExitCare® Patient Information ©2015 [employer]. This information is not intended to replace advice given to you by your health care provider. Make sure you discuss any questions you have with your health care provider. ° °

## 2014-07-15 NOTE — ED Notes (Signed)
Declined W/C at D/C and was escorted to lobby by RN. 

## 2014-07-15 NOTE — ED Notes (Signed)
He c/o L knee and elbow pain this week. He feels like it is his gout. He is not on gout prevention meds. He tried aleve with some relief.

## 2014-07-17 NOTE — Progress Notes (Signed)
Bryce West called the office to advise that he was unable to fill his colchicine prescription due to cost. He did fill his other prescriptions. His wife was going to call the [facility] to have [facility] [place] [phone] with confirmation received.

## 2014-07-17 NOTE — Progress Notes (Addendum)
EDCM [facility] due to physicians NPI number does not match.  EDCM explained to pharmacist that when we [facility], we do not need physician NPI number.  EDCM checked spelling of PA Piepenbrink with pharmacist.  [facility] that [facility] with same perscriber.  [facility]  No further EDCM needs at this time.  07/17/2014 A. Doriann Zuch RNCM 1942pm [facility] received phone call from pharmacist Summer requesting another physician to order prescribe colchicine.  EDCM spoke to EDP Romeo AppleHarrison who [facility] and he provided [facility] number, prescription/MATCH letter then was approved.  No further EDCM needs at this time.

## 2014-09-24 ENCOUNTER — Emergency Department (HOSPITAL_COMMUNITY)
Admission: EM | Admit: 2014-09-24 | Discharge: 2014-09-24 | Disposition: A | Discharge status: Home / Self Care | Payer: Self-pay | Attending: Emergency Medicine | Admitting: Emergency Medicine

## 2014-09-24 ENCOUNTER — Encounter (HOSPITAL_COMMUNITY): Payer: Self-pay | Admitting: *Deleted

## 2014-09-24 DIAGNOSIS — Z720 Tobacco use: Secondary | ICD-10-CM | POA: Insufficient documentation

## 2014-09-24 DIAGNOSIS — G8929 Other chronic pain: Secondary | ICD-10-CM | POA: Insufficient documentation

## 2014-09-24 DIAGNOSIS — Z791 Long term (current) use of non-steroidal anti-inflammatories (NSAID): Secondary | ICD-10-CM | POA: Insufficient documentation

## 2014-09-24 DIAGNOSIS — M1A9XX1 Chronic gout, unspecified, with tophus (tophi): Secondary | ICD-10-CM

## 2014-09-24 DIAGNOSIS — Z872 Personal history of diseases of the skin and subcutaneous tissue: Secondary | ICD-10-CM | POA: Insufficient documentation

## 2014-09-24 MED ORDER — NAPROXEN 500 MG PO TABS: 500.0000 mg | ORAL_TABLET | Freq: Two times a day (BID) | ORAL | Status: DC

## 2014-09-24 MED ORDER — COLCHICINE 0.6 MG PO TABS
0.6000 mg | ORAL_TABLET | Freq: Once | ORAL | Status: AC
Start: 2014-09-24 — End: 2014-09-24
  Administered 2014-09-24: 0.6 mg via ORAL
  Filled 2014-09-24: qty 1

## 2014-09-24 MED ORDER — COLCHICINE 0.6 MG PO TABS: ORAL_TABLET | ORAL | Status: AC

## 2014-09-24 NOTE — ED Provider Notes (Signed)
CSN: [id number]     Arrival date & time 09/24/14  1646 History  This chart was scribed for non-physician practitioner, Fayrene Helper, PA-C working with Linwood Dibbles, MD by Gwenyth Ober, ED scribe. This patient was seen in room TR07C/TR07C and the patient's care was started at 5:56 PM   Chief Complaint  Patient presents with  . Gout   The history is provided by the patient.    HPI Comments: Bryce West is a 49 y.o. male with a history of gout who presents to the Emergency Department complaining of an acute-on-chronic episode of gout affecting his right wrist, right elbow and right fingers that started 4-5 days ago. He states sharp, throbbing and aching pain and chills as associated symptoms. Pt reports a history of similar flare-ups which have been treated with Colchicine. 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 Pt denies fever.  Pt does admit to eating red meat, drink alcohol and smoke.  He understand that it can aggravates his gout.    No PCP  Past Medical History  Diagnosis Date  . Gout   . Eczema    History reviewed. No pertinent past surgical history. No family history on file. History  Substance Use Topics  . Smoking status: Current Every Day Smoker -- 1.00 packs/day    Types: Cigarettes  . Smokeless tobacco: Not on file  . Alcohol Use: Yes    Review of Systems  Constitutional: Positive for chills. Negative for fever.  Musculoskeletal: Positive for joint swelling and arthralgias.   Allergies  Vicodin  Home Medications   Prior to Admission medications   Medication Sig Start Date End Date Taking? Authorizing Provider  calamine lotion Apply 1 application topically 2 (two) times daily as needed (Eczema on hands).    Historical Provider, MD  colchicine 0.6 MG tablet 2 tabs po x 1, then one tab po 1 hour later 07/15/14   Francee Piccolo, PA-C  naproxen (NAPROSYN) 500 MG tablet Take 1 tablet (500 mg total) by mouth 2 (two) times daily with a meal.  07/15/14   Francee Piccolo, PA-C  naproxen sodium (ANAPROX) 220 MG tablet Take 220 mg by mouth daily as needed (Pain).    Historical Provider, MD  OVER THE COUNTER MEDICATION Take 1-2 tablets by mouth daily as needed (Pain relief). Generic Pain Relief medication    Historical Provider, MD  oxyCODONE-acetaminophen (PERCOCET/ROXICET) 5-325 MG per tablet Take 1 tablet by mouth every 6 (six) hours as needed for severe pain. 07/15/14   Jennifer Piepenbrink, PA-C   BP 171/92 mmHg  Pulse 85  Temp(Src) 98.9 F (37.2 C) (Oral)  Resp 16  SpO2 98% Physical Exam  Constitutional: He appears well-developed and well-nourished. No distress.  HENT:  Head: Normocephalic and atraumatic.  Eyes: Conjunctivae and EOM are normal.  Neck: Neck supple. No tracheal deviation present.  Cardiovascular: Normal rate.   Pulmonary/Chest: Effort normal. No respiratory distress.  Musculoskeletal: He exhibits edema.  Right wrist: edematous, TTP, without crepitus or deformity; normal wrist flexion, extension, supination and protonation; Enlarged MCP joints in right hand involving 1st, 2nd, 3rd, 4th and 5th digits with evidence of tophaceous gout  Skin: Skin is warm and dry.  Psychiatric: He has a normal mood and affect. His behavior is normal.  Nursing note and vitals reviewed.   ED Course  Procedures   DIAGNOSTIC STUDIES: Oxygen Saturation is 98% on RA, normal by my interpretation.    COORDINATION OF CARE: 6:00 PM Symptoms consistent with tophaceous  gout. Pt has a history of the same. Advised pt to prevent gout by abstaining from alcohol, smoking and red meat. Discussed treatment plan with pt at bedside and pt agreed to plan.  Labs Review Labs Reviewed - No data to display  Imaging Review No results found.   EKG Interpretation None      MDM   Final diagnoses:  Tophaceous gout    BP 171/92 mmHg  Pulse 85  Temp(Src) 98.9 F (37.2 C) (Oral)  Resp 16  SpO2 98%   I personally performed the  services described in this documentation, which was scribed in my presence. The recorded information has been reviewed and is accurate.     Fayrene Helper, PA-C 09/24/14 1808  Linwood Dibbles, MD 09/24/14 2252

## 2014-09-24 NOTE — Discharge Instructions (Signed)
Gout Gout is an inflammatory arthritis caused by a buildup of uric acid crystals in the joints. Uric acid is a chemical that is normally present in the blood. When the level of uric acid in the blood is too high it can form crystals that deposit in your joints and tissues. This causes joint redness, soreness, and swelling (inflammation). Repeat attacks are common. Over time, uric acid crystals can form into masses (tophi) near a joint, destroying bone and causing disfigurement. Gout is treatable and often preventable. CAUSES  The disease begins with elevated levels of uric acid in the blood. Uric acid is produced by your body when it breaks down a naturally found substance called purines. Certain foods you eat, such as meats and fish, contain high amounts of purines. Causes of an elevated uric acid level include:  Being passed down from parent to child (heredity).  Diseases that cause increased uric acid production (such as obesity, psoriasis, and certain cancers).  Excessive alcohol use.  Diet, especially diets rich in meat and seafood.  Medicines, including certain cancer-fighting medicines (chemotherapy), water pills (diuretics), and aspirin.  Chronic kidney disease. The kidneys are no longer able to remove uric acid well.  Problems with metabolism. Conditions strongly associated with gout include:  Obesity.  High blood pressure.  High cholesterol.  Diabetes. Not everyone with elevated uric acid levels gets gout. It is not understood why some people get gout and others do not. Surgery, joint injury, and eating too much of certain foods are some of the factors that can lead to gout attacks. SYMPTOMS   An attack of gout comes on quickly. It causes intense pain with redness, swelling, and warmth in a joint.  Fever can occur.  Often, only one joint is involved. Certain joints are more commonly involved:  Base of the big toe.  Knee.  Ankle.  Wrist.  Finger. Without  treatment, an attack usually goes away in a few days to weeks. Between attacks, you usually will not have symptoms, which is different from many other forms of arthritis. DIAGNOSIS  Your caregiver will suspect gout based on your symptoms and exam. In some cases, tests may be recommended. The tests may include:  Blood tests.  Urine tests.  X-rays.  Joint fluid exam. This exam requires a needle to remove fluid from the joint (arthrocentesis). Using a microscope, gout is confirmed when uric acid crystals are seen in the joint fluid. TREATMENT  There are two phases to gout treatment: treating the sudden onset (acute) attack and preventing attacks (prophylaxis).  Treatment of an Acute Attack.  Medicines are used. These include anti-inflammatory medicines or steroid medicines.  An injection of steroid medicine into the affected joint is sometimes necessary.  The painful joint is rested. Movement can worsen the arthritis.  You may use warm or cold treatments on painful joints, depending which works best for you.  Treatment to Prevent Attacks.  If you suffer from frequent gout attacks, your caregiver may advise preventive medicine. These medicines are started after the acute attack subsides. These medicines either help your kidneys eliminate uric acid from your body or decrease your uric acid production. You may need to stay on these medicines for a very long time.  The early phase of treatment with preventive medicine can be associated with an increase in acute gout attacks. For this reason, during the first few months of treatment, your caregiver may also advise you to take medicines usually used for acute gout treatment. Be sure you   understand your caregiver's directions. Your caregiver may make several adjustments to your medicine dose before these medicines are effective.  Discuss dietary treatment with your caregiver or dietitian. Alcohol and drinks high in sugar and fructose and foods  such as meat, poultry, and seafood can increase uric acid levels. Your caregiver or dietitian can advise you on drinks and foods that should be limited. HOME CARE INSTRUCTIONS   Do not take aspirin to relieve pain. This raises uric acid levels.  Only take over-the-counter or prescription medicines for pain, discomfort, or fever as directed by your caregiver.  Rest the joint as much as possible. When in bed, keep sheets and blankets off painful areas.  Keep the affected joint raised (elevated).  Apply warm or cold treatments to painful joints. Use of warm or cold treatments depends on which works best for you.  Use crutches if the painful joint is in your leg.  Drink enough fluids to keep your urine clear or pale yellow. This helps your body get rid of uric acid. Limit alcohol, sugary drinks, and fructose drinks.  Follow your dietary instructions. Pay careful attention to the amount of protein you eat. Your daily diet should emphasize fruits, vegetables, whole grains, and fat-free or low-fat milk products. Discuss the use of coffee, vitamin C, and cherries with your caregiver or dietitian. These may be helpful in lowering uric acid levels.  Maintain a healthy body weight. SEEK MEDICAL CARE IF:   You develop diarrhea, vomiting, or any side effects from medicines.  You do not feel better in 24 hours, or you are getting worse. SEEK IMMEDIATE MEDICAL CARE IF:   Your joint becomes suddenly more tender, and you have chills or a fever. MAKE SURE YOU:   Understand these instructions.  Will watch your condition.  Will get help right away if you are not doing well or get worse. Document Released: 02/13/2000 Document Revised: 07/02/2013 Document Reviewed: 09/29/2011 ExitCare Patient Information 2015 [employer]. This information is not intended to replace advice given to you by your health care provider. Make sure you discuss any questions you have with your health care  provider. Low-Purine Diet Purines are compounds that affect the level of uric acid in your body. A low-purine diet is a diet that is low in purines. Eating a low-purine diet can prevent the level of uric acid in your body from getting too high and causing gout or kidney stones or both. WHAT DO I NEED TO KNOW ABOUT THIS DIET?  Choose low-purine foods. Examples of low-purine foods are listed in the next section.  Drink plenty of fluids, especially water. Fluids can help remove uric acid from your body. Try to drink 8-16 cups (1.9-3.8 L) a day.  Limit foods high in fat, especially saturated fat, as fat makes it harder for the body to get rid of uric acid. Foods high in saturated fat include pizza, cheese, ice cream, whole milk, fried foods, and gravies. Choose foods that are lower in fat and lean sources of protein. Use olive oil when cooking as it contains healthy fats that are not high in saturated fat.  Limit alcohol. Alcohol interferes with the elimination of uric acid from your body. If you are having a gout attack, avoid all alcohol.  Keep in mind that different people's bodies react differently to different foods. You will probably learn over time which foods do or do not affect you. If you discover that a food tends to cause your gout to flare up,   avoid eating that food. You can more freely enjoy foods that do not cause problems. If you have any questions about a food item, talk to your dietitian or health care provider. WHICH FOODS ARE LOW, MODERATE, AND HIGH IN PURINES? The following is a list of foods that are low, moderate, and high in purines. You can eat any amount of the foods that are low in purines. You may be able to have small amounts of foods that are moderate in purines. Ask your health care provider how much of a food moderate in purines you can have. Avoid foods high in purines. Grains  Foods low in purines: Enriched white bread, pasta, rice, cake, cornbread, popcorn.  Foods  moderate in purines: Whole-grain breads and cereals, wheat germ, bran, oatmeal. Uncooked oatmeal. Dry wheat bran or wheat germ.  Foods high in purines: Pancakes, French toast, biscuits, muffins. Vegetables  Foods low in purines: All vegetables, except those that are moderate in purines.  Foods moderate in purines: Asparagus, cauliflower, spinach, mushrooms, green peas. Fruits  All fruits are low in purines. Meats and other Protein Foods  Foods low in purines: Eggs, nuts, peanut butter.  Foods moderate in purines: 80-90% lean beef, lamb, veal, pork, poultry, fish, eggs, peanut butter, nuts. Crab, lobster, oysters, and shrimp. Cooked dried beans, peas, and lentils.  Foods high in purines: Anchovies, sardines, herring, mussels, tuna, codfish, scallops, trout, and haddock. Bacon. Organ meats (such as liver or kidney). Tripe. Game meat. Goose. Sweetbreads. Dairy  All dairy foods are low in purines. Low-fat and fat-free dairy products are best because they are low in saturated fat. Beverages  Drinks low in purines: Water, carbonated beverages, tea, coffee, cocoa.  Drinks moderate in purines: Soft drinks and other drinks sweetened with high-fructose corn syrup. Juices. To find whether a food or drink is sweetened with high-fructose corn syrup, look at the ingredients list.  Drinks high in purines: Alcoholic beverages (such as beer). Condiments  Foods low in purines: Salt, herbs, olives, pickles, relishes, vinegar.  Foods moderate in purines: Butter, margarine, oils, mayonnaise. Fats and Oils  Foods low in purines: All types, except gravies and sauces made with meat.  Foods high in purines: Gravies and sauces made with meat. Other Foods  Foods low in purines: Sugars, sweets, gelatin. Cake. Soups made without meat.  Foods moderate in purines: Meat-based or fish-based soups, broths, or bouillons. Foods and drinks sweetened with high-fructose corn syrup.  Foods high in purines:  High-fat desserts (such as ice cream, cookies, cakes, pies, doughnuts, and chocolate). Contact your dietitian for more information on foods that are not listed here. Document Released: 06/12/2010 Document Revised: 02/20/2013 Document Reviewed: 01/22/2013 ExitCare Patient Information 2015 [employer]. This information is not intended to replace advice given to you by your health care provider. Make sure you discuss any questions you have with your health care provider.  

## 2014-09-24 NOTE — ED Notes (Signed)
Pt states that he has gout in his right wrist. States that he has ran out of his prescriptions. States he needs colchicine to help with the swelling.

## 2014-11-04 ENCOUNTER — Emergency Department (HOSPITAL_COMMUNITY)
Admission: EM | Admit: 2014-11-04 | Discharge: 2014-11-04 | Disposition: A | Discharge status: Home / Self Care | Payer: Self-pay | Attending: Emergency Medicine | Admitting: Emergency Medicine

## 2014-11-04 ENCOUNTER — Encounter (HOSPITAL_COMMUNITY): Payer: Self-pay | Admitting: Emergency Medicine

## 2014-11-04 DIAGNOSIS — M109 Gout, unspecified: Secondary | ICD-10-CM | POA: Insufficient documentation

## 2014-11-04 DIAGNOSIS — Z79899 Other long term (current) drug therapy: Secondary | ICD-10-CM | POA: Insufficient documentation

## 2014-11-04 DIAGNOSIS — Z720 Tobacco use: Secondary | ICD-10-CM | POA: Insufficient documentation

## 2014-11-04 DIAGNOSIS — L0291 Cutaneous abscess, unspecified: Secondary | ICD-10-CM

## 2014-11-04 DIAGNOSIS — L0211 Cutaneous abscess of neck: Secondary | ICD-10-CM | POA: Insufficient documentation

## 2014-11-04 MED ORDER — SULFAMETHOXAZOLE-TRIMETHOPRIM 800-160 MG PO TABS: 1.0000 | ORAL_TABLET | Freq: Two times a day (BID) | ORAL | Status: AC

## 2014-11-04 MED ORDER — LIDOCAINE HCL (PF) 1 % IJ SOLN
5.0000 mL | Freq: Once | INTRAMUSCULAR | Status: AC
  Administered 2014-11-04: 5 mL
  Filled 2014-11-04: qty 5

## 2014-11-04 NOTE — ED Provider Notes (Signed)
CSN: [id number]     Arrival date & time 11/04/14  1200 History  This chart was scribed for non-physician practitioner, Roxy Horseman, PA-C working with Tilden Fossa, MD by Doreatha Martin, ED scribe. This patient was seen in room TR09C/TR09C and the patient's care was started at 12:14 PM    Chief Complaint  Patient presents with  . Abscess   The history is provided by the patient. No language interpreter was used.    HPI Comments: Bryce West is a 49 y.o. male who presents to the Emergency Department with a chief complaint of a moderate, gradually worsening area of pain and swelling behind the right earlobe onset 2 days ago. Pt states he believes a spider bit him, but did not see a spider or feel a specific bite. He states his wife has attempted to drain the area twice and he has applied neosporin and rubbing alcohol PTA. No Hx of DM. NKDA. He denies fever.     Past Medical History  Diagnosis Date  . Gout   . Eczema    History reviewed. No pertinent past surgical history. No family history on file. Social History  Substance Use Topics  . Smoking status: Current Every Day Smoker -- 1.00 packs/day    Types: Cigarettes  . Smokeless tobacco: None  . Alcohol Use: Yes    Review of Systems  Constitutional: Negative for fever.  Skin: Positive for wound ( area of pain and swelling behind the right earlobe ).   Allergies  Vicodin  Home Medications   Prior to Admission medications   Medication Sig Start Date End Date Taking? Authorizing Provider  calamine lotion Apply 1 application topically 2 (two) times daily as needed (Eczema on hands).    Historical Provider, MD  colchicine 0.6 MG tablet 2 tabs po x 1, then one tab po 1 hour later 09/24/14   Fayrene Helper, PA-C  naproxen (NAPROSYN) 500 MG tablet Take 1 tablet (500 mg total) by mouth 2 (two) times daily with a meal. 09/24/14   Fayrene Helper, PA-C  naproxen sodium (ANAPROX) 220 MG tablet Take 220 mg by mouth daily as needed (Pain).     Historical Provider, MD  OVER THE COUNTER MEDICATION Take 1-2 tablets by mouth daily as needed (Pain relief). Generic Pain Relief medication    Historical Provider, MD  oxyCODONE-acetaminophen (PERCOCET/ROXICET) 5-325 MG per tablet Take 1 tablet by mouth every 6 (six) hours as needed for severe pain. 07/15/14   Jennifer Piepenbrink, PA-C   BP 166/87 mmHg  Pulse 78  Temp(Src) 98.5 F (36.9 C) (Oral)  Resp 16  Ht  (1.778 m)  Wt 140 lb 4.8 oz (63.64 kg)  BMI 20.13 kg/m2  SpO2 100% Physical Exam  Constitutional: He is oriented to person, place, and time. He appears well-developed and well-nourished.  HENT:  Head: Normocephalic and atraumatic.  1 cm fluctuant abscess on right lateral neck adjacent to earlobe, no mastoid tenderness, ear canal is clear, tympanic membrane is normal  Eyes: Conjunctivae and EOM are normal. Pupils are equal, round, and reactive to light.  Neck: Normal range of motion. Neck supple.  Cardiovascular: Normal rate.   Pulmonary/Chest: Effort normal. No respiratory distress.  Abdominal: He exhibits no distension.  Musculoskeletal: Normal range of motion.  Neurological: He is alert and oriented to person, place, and time.  Skin: Skin is warm and dry.  No evidence of cellulitis  Psychiatric: He has a normal mood and affect. His behavior is normal.  Nursing  note and vitals reviewed.   ED Course  Procedures (including critical care time) DIAGNOSTIC STUDIES: Oxygen Saturation is 100% on RA, normal by my interpretation.    COORDINATION OF CARE: 12:16 PM Discussed treatment plan with pt at bedside and pt agreed to plan.   12:30 PM INCISION AND DRAINAGE  Performed by: Roxy Horseman, PA-C Authorized by: Roxy Horseman, PA-C Consent - Verbal Consent obtained Risks and benefits: risks/benefits and alternatives were discussed  Type: Abscess  Body Area: Right posterior neck adjacent to earlobe  Anesthesia: Local infiltration Local anesthetic: lidocaine 2%  without epinephrine  Anesthetic total: 1ml  Complexity: Complex  Blunt dissection to break up loculations  Drainage: Purulent  Drainage amount: 2 ml aspirated with 18-gauge needle  Packing material: not packed  Patient tolerance: Patient tolerated the procedure well with no immediate complications   MDM   Final diagnoses:  Abscess    Patient with skin abscess amenable to incision and drainage.  Abscess was not large enough to warrant packing or drain,  wound recheck in 2 days. Encouraged home warm soaks and flushing.  Mild signs of cellulitis is surrounding skin.  Will d/c to home.    I personally performed the services described in this documentation, which was scribed in my presence. The recorded information has been reviewed and is accurate.    Roxy Horseman, PA-C 11/04/14 1300  Tilden Fossa, MD 11/04/14 [phone]

## 2014-11-04 NOTE — ED Notes (Signed)
Declined W/C at D/C and was escorted to lobby by RN. 

## 2014-11-04 NOTE — ED Notes (Signed)
Lidocaine and I&D Tray at the bedside.

## 2014-11-04 NOTE — ED Notes (Signed)
Pt from home for eval of possible abscess to right ear lob x2 days, pt denies any n/v/d or fevers. No drainage noted at this time. Pt denies any sob or neck tenderness just tenderness to back of ear. nad noted at this time.

## 2014-11-04 NOTE — Discharge Instructions (Signed)

## 2016-08-26 NOTE — Congregational Nurse Program (Signed)
Congregational Nurse Program Note  Date of Encounter: 08/02/2016  Past Medical History: Past Medical History:  Diagnosis Date  . Eczema   . Gout     Encounter Details:     CNP Questionnaire - 08/02/16 1932      Patient Demographics   Is this a new or existing patient? New   Patient is considered a/an Not Applicable   Race African-American/Black     Patient Assistance   Location of Patient Assistance Not Applicable   Patient's financial/insurance status Low Income;Self-Pay (Uninsured)   Uninsured Patient ([employer], trade [employer]Card/Care Connects) Yes   Interventions Not Applicable   Patient referred to apply for the following financial assistance Not Applicable   Food insecurities addressed Not Applicable   Transportation assistance Yes   Type of Assistance Bus Pass Given   Assistance securing medications No   Educational health offerings Behavioral health;Spiritual care     Encounter Details   Primary purpose of visit Chronic Illness/Condition Visit;Education/Health Concerns;Safety;Spiritual Care/Support Visit;Navigating the Healthcare System   Was an Emergency Department visit averted? Not Applicable   Does patient have a medical provider? Yes   Patient referred to Area Agency   Was a mental health screening completed? (GAINS tool) No   Does patient have dental issues? No   Does patient have vision issues? No   Does your patient have an abnormal blood pressure today? No   Since previous encounter, have you referred patient for abnormal blood pressure that resulted in a new diagnosis or medication change? No   Does your patient have an abnormal blood glucose today? No   Since previous encounter, have you referred patient for abnormal blood glucose that resulted in a new diagnosis or medication change? No   Was there a life-saving intervention made? No      States relasped after being "clean" for over a year.  Tearful, requesting help.  Referred to [facility].   Provided support and encouragement.  Bus passes given

## 2017-04-11 ENCOUNTER — Other Ambulatory Visit: Payer: Self-pay

## 2017-04-11 ENCOUNTER — Encounter (HOSPITAL_COMMUNITY): Payer: Self-pay | Admitting: Emergency Medicine

## 2017-04-11 DIAGNOSIS — R0789 Other chest pain: Secondary | ICD-10-CM | POA: Insufficient documentation

## 2017-04-11 DIAGNOSIS — F17210 Nicotine dependence, cigarettes, uncomplicated: Secondary | ICD-10-CM | POA: Insufficient documentation

## 2017-04-11 NOTE — ED Triage Notes (Signed)
Pt c/o bilateral 10/10 cp for the past 2 weeks getting worse today with some SOB, no nausea, vomiting fever or dizziness.

## 2017-04-12 ENCOUNTER — Emergency Department (HOSPITAL_COMMUNITY): Payer: Self-pay

## 2017-04-12 ENCOUNTER — Emergency Department (HOSPITAL_COMMUNITY)
Admission: EM | Admit: 2017-04-12 | Discharge: 2017-04-12 | Disposition: A | Discharge status: Home / Self Care | Payer: Self-pay | Attending: Emergency Medicine | Admitting: Emergency Medicine

## 2017-04-12 DIAGNOSIS — R0789 Other chest pain: Secondary | ICD-10-CM

## 2017-04-12 LAB — BASIC METABOLIC PANEL
Anion gap: 12 (ref 5–15)
BUN: 10 mg/dL (ref 6–20)
CALCIUM: 9 mg/dL (ref 8.9–10.3)
CO2: 24 mmol/L (ref 22–32)
CREATININE: 0.85 mg/dL (ref 0.61–1.24)
Chloride: 99 mmol/L — ABNORMAL LOW (ref 101–111)
GFR calc Af Amer: >60 mL/min (ref >60)
GFR calc non Af Amer: >60 mL/min (ref >60)
Glucose, Bld: 113 mg/dL — ABNORMAL HIGH (ref 65–99)
Potassium: 3.2 mmol/L — ABNORMAL LOW (ref 3.5–5.1)
Sodium: 135 mmol/L (ref 135–145)

## 2017-04-12 LAB — CBC
HCT: 37.3 % — ABNORMAL LOW (ref 39.0–52.0)
Hemoglobin: 12.7 g/dL — ABNORMAL LOW (ref 13.0–17.0)
MCH: 31.8 pg (ref 26.0–34.0)
MCHC: 34 g/dL (ref 30.0–36.0)
MCV: 93.3 fL (ref 78.0–100.0)
Platelets: 311 10*3/uL (ref 150–400)
RBC: 4 MIL/uL — ABNORMAL LOW (ref 4.22–5.81)
RDW: 14.3 % (ref 11.5–15.5)
WBC: 17.4 10*3/uL — ABNORMAL HIGH (ref 4.0–10.5)

## 2017-04-12 LAB — I-STAT TROPONIN, ED
TROPONIN I, POC: 0 ng/mL (ref 0.00–0.08)
Troponin i, poc: 0 ng/mL (ref 0.00–0.08)

## 2017-04-12 LAB — D-DIMER, QUANTITATIVE (NOT AT ARMC): D DIMER QUANT: 3.17 ug{FEU}/mL — AB (ref 0.00–0.50)

## 2017-04-12 MED ORDER — KETOROLAC TROMETHAMINE 30 MG/ML IJ SOLN
30.0000 mg | Freq: Once | INTRAMUSCULAR | Status: AC
  Administered 2017-04-12: 30 mg via INTRAVENOUS
  Filled 2017-04-12: qty 1

## 2017-04-12 MED ORDER — NAPROXEN 500 MG PO TABS: 500.0000 mg | ORAL_TABLET | Freq: Two times a day (BID) | ORAL | 0 refills | Status: AC

## 2017-04-12 MED ORDER — IOPAMIDOL (ISOVUE-370) INJECTION 76%
INTRAVENOUS | Status: AC
  Administered 2017-04-12: 100 mL
  Filled 2017-04-12: qty 100

## 2017-04-12 NOTE — ED Notes (Signed)
Patient ambulatory to exit; e-sign scanner not working - verbalized understanding

## 2017-04-12 NOTE — Discharge Instructions (Signed)
There is no evidence of heart attack or blood clot in the lung. Follow up with your doctor. Return to the ED if you develop new or worsening symptoms.  °

## 2017-04-12 NOTE — ED Provider Notes (Signed)
[facility] EMERGENCY DEPARTMENT Provider Note   CSN: [id number] Arrival date & time: 04/11/17  2348     History   Chief Complaint Chief Complaint  Patient presents with  . Chest Pain    HPI Bryce West is a 52 y.o. male.  Patient presents with a 2-day (contrary to triage note) history of bilateral rib pain that is worse with coughing and deep breathing.  He denies any falls or injury.  He states the pain is constant but worse when he moves his body and twists his torso or has deep breathing or coughing.  This is associated with some shortness of breath.  Patient states he had a URI last week and was taking cough medications.  He is not clear whether he strained something from coughing.  Denies fever, chills, nausea or vomiting.  No diaphoresis.  Denies any cardiac history.  History of hypertension and gout.  Denies any cocaine use.  He stopped smoking 2 days ago.  He has been taking naproxen for the site pain without relief.  The pain is not exertional and never goes away completely.   The history is provided by the patient.  Chest Pain   Associated symptoms include cough and shortness of breath. Pertinent negatives include no abdominal pain, no dizziness, no fever, no headaches, no nausea, no palpitations, no vomiting and no weakness.    Past Medical History:  Diagnosis Date  . Eczema   . Gout     There are no active problems to display for this patient.   History reviewed. No pertinent surgical history.     Home Medications    Prior to Admission medications   Medication Sig Start Date End Date Taking? Authorizing Provider  calamine lotion Apply 1 application topically 2 (two) times daily as needed (Eczema on hands).    [provider]  colchicine 0.6 MG tablet 2 tabs po x 1, then one tab po 1 hour later 09/24/14   Fayrene Helper, PA-C  naproxen (NAPROSYN) 500 MG tablet Take 1 tablet (500 mg total) by mouth 2 (two) times daily with a meal.  09/24/14   Fayrene Helper, PA-C  naproxen sodium (ANAPROX) 220 MG tablet Take 220 mg by mouth daily as needed (Pain).    [provider]  OVER THE COUNTER MEDICATION Take 1-2 tablets by mouth daily as needed (Pain relief). Generic Pain Relief medication    [provider]  oxyCODONE-acetaminophen (PERCOCET/ROXICET) 5-325 MG per tablet Take 1 tablet by mouth every 6 (six) hours as needed for severe pain. 07/15/14   Piepenbrink, Victorino Dike, PA-C    Family History No family history on file.  Social History Social History   Tobacco Use  . Smoking status: Current Every Day Smoker    Packs/day: 1.00    Types: Cigarettes  Substance Use Topics  . Alcohol use: Yes  . Drug use: Yes    Types: Marijuana    Comment: daily     Allergies   Vicodin [hydrocodone-acetaminophen]   Review of Systems Review of Systems  Constitutional: Negative for activity change, appetite change and fever.  HENT: Negative for congestion and rhinorrhea.   Eyes: Negative for visual disturbance.  Respiratory: Positive for cough, chest tightness and shortness of breath.   Cardiovascular: Positive for chest pain. Negative for palpitations.  Gastrointestinal: Negative for abdominal pain, nausea and vomiting.  Genitourinary: Negative for dysuria, hematuria, testicular pain and urgency.  Musculoskeletal: Negative for arthralgias and myalgias.  Neurological: Negative for dizziness,  weakness and headaches.   all other systems are negative except as noted in the HPI and PMH.     Physical Exam Updated Vital Signs BP (!) 164/80   Pulse 88   Temp 98.4 F (36.9 C) (Oral)   Resp 20   Ht 5\' 11"  (1.803 m)   Wt 63.5 kg (140 lb)   SpO2 99%   BMI 19.53 kg/m   Physical Exam  Constitutional: He is oriented to person, place, and time. He appears well-developed and well-nourished. No distress.  HENT:  Head: Normocephalic and atraumatic.  Mouth/Throat: Oropharynx is clear and moist. No oropharyngeal exudate.   Eyes: Conjunctivae and EOM are normal. Pupils are equal, round, and reactive to light.  Neck: Normal range of motion. Neck supple.  No meningismus.  Cardiovascular: Normal rate, regular rhythm, normal heart sounds and intact distal pulses.  No murmur heard. Pulmonary/Chest: Effort normal and breath sounds normal. No respiratory distress. He exhibits no tenderness.  Lungs are clear there is no pain with palpation.  Pain is worse with twisting of patient's torso, coughing and deep breathing  Abdominal: Soft. There is no tenderness. There is no rebound and no guarding.  Musculoskeletal: Normal range of motion. He exhibits no edema or tenderness.  Neurological: He is alert and oriented to person, place, and time. No cranial nerve deficit. He exhibits normal muscle tone. Coordination normal.  No ataxia on finger to nose bilaterally. No pronator drift. 5/5 strength throughout. CN 2-12 intact.Equal grip strength. Sensation intact.   Skin: Skin is warm.  Psychiatric: He has a normal mood and affect. His behavior is normal.  Nursing note and vitals reviewed.    ED Treatments / Results  Labs (all labs ordered are listed, but only abnormal results are displayed) Labs Reviewed  BASIC METABOLIC PANEL - Abnormal; Notable for the following components:      Result Value   Potassium 3.2 (*)    Chloride 99 (*)    Glucose, Bld 113 (*)    All other components within normal limits  CBC - Abnormal; Notable for the following components:   WBC 17.4 (*)    RBC 4.00 (*)    Hemoglobin 12.7 (*)    HCT 37.3 (*)    All other components within normal limits  D-DIMER, QUANTITATIVE (NOT AT [facility])  I-STAT TROPONIN, ED  I-STAT TROPONIN, ED    EKG  EKG Interpretation  Date/Time:  Monday April 11 2017 23:52:21 EST Ventricular Rate:  97 PR Interval:  120 QRS Duration: 90 QT Interval:  348 QTC Calculation: 441 R Axis:   69 Text Interpretation:  Normal sinus rhythm Left ventricular hypertrophy with  repolarization abnormality Abnormal ECG new T wave changes inferiorly Confirmed by Glynn Octave [phone]) on 04/12/2017 2:54:33 AM       Radiology Dg Chest 2 View  Result Date: 04/12/2017 CLINICAL DATA:  Chest pain for the past 2 weeks worsening today with dyspnea. EXAM: CHEST  2 VIEW COMPARISON:  CT 10/19/2009 FINDINGS: The heart size and mediastinal contours are within normal limits. Left lower lobe scarring is redemonstrated. No pneumonic consolidation or CHF. No effusion or pneumothorax. The visualized skeletal structures are unremarkable. IMPRESSION: No active cardiopulmonary disease. Electronically Signed   By: Tollie Eth M.D.   On: 04/12/2017 00:11   Ct Angio Chest Pe W And/or Wo Contrast  Result Date: 04/12/2017 CLINICAL DATA:  Subacute onset of bilateral chest pain and shortness of breath. EXAM: CT ANGIOGRAPHY CHEST WITH CONTRAST TECHNIQUE: Multidetector CT imaging of  the chest was performed using the standard protocol during bolus administration of intravenous contrast. Multiplanar CT image reconstructions and MIPs were obtained to evaluate the vascular anatomy. CONTRAST:  100mL ISOVUE-370 IOPAMIDOL (ISOVUE-370) INJECTION 76% COMPARISON:  Chest radiograph performed earlier today at 12:03 a.m., and CTA of the chest performed 10/19/2009 FINDINGS: Cardiovascular: There is no evidence of aortic dissection. There is no evidence of aneurysmal dilatation. Scattered calcification is seen along the thoracic aorta. The great vessels are unremarkable in appearance. No significant pulmonary embolus is seen. The heart is normal in size. Mediastinum/Nodes: The mediastinum is unremarkable in appearance. No mediastinal lymphadenopathy is seen. No pericardial effusion is identified. The visualized portions of the thyroid gland are unremarkable. No axillary lymphadenopathy is seen. Lungs/Pleura: Minimal bilateral atelectasis is noted. No pleural effusion or pneumothorax is seen. No masses are identified. Upper  Abdomen: The visualized portions of the liver and spleen are unremarkable. The visualized portions of the gallbladder, pancreas, adrenal glands and kidneys are within normal limits. Musculoskeletal: No acute osseous abnormalities are identified. The visualized musculature is unremarkable in appearance. Review of the MIP images confirms the above findings. IMPRESSION: 1. No evidence of aortic dissection. No evidence of aneurysmal dilatation. 2. No evidence of significant pulmonary embolus. 3. Minimal bilateral atelectasis.  Lungs otherwise clear. Electronically Signed   By: Roanna RaiderJeffery  Chang M.D.   On: 04/12/2017 04:58    Procedures Procedures (including critical care time)  Medications Ordered in ED Medications  ketorolac (TORADOL) 30 MG/ML injection 30 mg (not administered)     Initial Impression / Assessment and Plan / ED Course  I have reviewed the triage vital signs and the nursing notes.  Pertinent labs & imaging results that were available during my care of the patient were reviewed by me and considered in my medical decision making (see chart for details).    2 days of bilateral rib pain that is worse with movement and deep breathing and coughing.  EKG shows LVH with new T wave changes.  Chest x-ray is negative.  Troponin negative.  D-dimer is positive, subsequent CT PE is negative.  Troponin negative x2.  Pain improved with Toradol.  Low suspicion for ACS or PE.  EKG changes likely due to LVH.  Suspect musculoskeletal pain from coughing episodes last week. Follow-up with PCP.  Return precautions discussed.  Final Clinical Impressions(s) / ED Diagnoses   Final diagnoses:  Atypical chest pain    ED Discharge Orders    None       Jhace Fennell, Jeannett SeniorStephen, MD 04/12/17 [phone]

## 2019-01-19 ENCOUNTER — Other Ambulatory Visit: Payer: Self-pay | Admitting: Urology

## 2019-01-19 ENCOUNTER — Other Ambulatory Visit: Payer: Self-pay | Admitting: Nurse Practitioner

## 2019-01-19 DIAGNOSIS — F1010 Alcohol abuse, uncomplicated: Secondary | ICD-10-CM

## 2019-01-19 DIAGNOSIS — R1011 Right upper quadrant pain: Secondary | ICD-10-CM

## 2019-01-22 ENCOUNTER — Other Ambulatory Visit: Payer: Self-pay | Admitting: Nurse Practitioner

## 2019-01-24 ENCOUNTER — Ambulatory Visit
Admission: RE | Admit: 2019-01-24 | Discharge: 2019-01-24 | Disposition: A | Discharge status: Home / Self Care | Payer: BLUE CROSS/BLUE SHIELD | Source: Ambulatory Visit | Attending: Nurse Practitioner | Admitting: Nurse Practitioner

## 2019-01-24 DIAGNOSIS — F1010 Alcohol abuse, uncomplicated: Secondary | ICD-10-CM

## 2019-01-24 DIAGNOSIS — R1011 Right upper quadrant pain: Secondary | ICD-10-CM

## 2019-01-24 MED ORDER — IOPAMIDOL (ISOVUE-300) INJECTION 61%
100.0000 mL | Freq: Once | INTRAVENOUS | Status: AC | PRN
  Administered 2019-01-24: 100 mL via INTRAVENOUS

## 2019-01-31 ENCOUNTER — Other Ambulatory Visit: Payer: Self-pay

## 2020-03-26 DIAGNOSIS — M25512 Pain in left shoulder: Secondary | ICD-10-CM | POA: Diagnosis not present

## 2020-03-26 DIAGNOSIS — F1010 Alcohol abuse, uncomplicated: Secondary | ICD-10-CM | POA: Diagnosis not present

## 2020-03-26 DIAGNOSIS — M25511 Pain in right shoulder: Secondary | ICD-10-CM | POA: Diagnosis not present

## 2020-03-26 DIAGNOSIS — F1210 Cannabis abuse, uncomplicated: Secondary | ICD-10-CM | POA: Diagnosis not present

## 2020-03-26 DIAGNOSIS — M109 Gout, unspecified: Secondary | ICD-10-CM | POA: Diagnosis not present

## 2020-03-26 DIAGNOSIS — Z125 Encounter for screening for malignant neoplasm of prostate: Secondary | ICD-10-CM | POA: Diagnosis not present

## 2020-03-26 DIAGNOSIS — I10 Essential (primary) hypertension: Secondary | ICD-10-CM | POA: Diagnosis not present

## 2020-03-26 DIAGNOSIS — Z7189 Other specified counseling: Secondary | ICD-10-CM | POA: Diagnosis not present

## 2020-03-26 DIAGNOSIS — R17 Unspecified jaundice: Secondary | ICD-10-CM | POA: Diagnosis not present

## 2020-04-02 DIAGNOSIS — Z7189 Other specified counseling: Secondary | ICD-10-CM | POA: Diagnosis not present

## 2020-04-02 DIAGNOSIS — Z0000 Encounter for general adult medical examination without abnormal findings: Secondary | ICD-10-CM | POA: Diagnosis not present

## 2020-04-02 DIAGNOSIS — L209 Atopic dermatitis, unspecified: Secondary | ICD-10-CM | POA: Diagnosis not present

## 2020-04-02 DIAGNOSIS — I10 Essential (primary) hypertension: Secondary | ICD-10-CM | POA: Diagnosis not present

## 2020-04-16 DIAGNOSIS — I10 Essential (primary) hypertension: Secondary | ICD-10-CM | POA: Diagnosis not present

## 2020-04-16 DIAGNOSIS — Z7189 Other specified counseling: Secondary | ICD-10-CM | POA: Diagnosis not present

## 2020-04-16 DIAGNOSIS — L2082 Flexural eczema: Secondary | ICD-10-CM | POA: Diagnosis not present

## 2020-04-16 DIAGNOSIS — F1210 Cannabis abuse, uncomplicated: Secondary | ICD-10-CM | POA: Diagnosis not present

## 2020-04-16 DIAGNOSIS — F17210 Nicotine dependence, cigarettes, uncomplicated: Secondary | ICD-10-CM | POA: Diagnosis not present

## 2020-04-16 DIAGNOSIS — F1010 Alcohol abuse, uncomplicated: Secondary | ICD-10-CM | POA: Diagnosis not present

## 2020-05-07 DIAGNOSIS — Z7189 Other specified counseling: Secondary | ICD-10-CM | POA: Diagnosis not present

## 2020-05-07 DIAGNOSIS — J302 Other seasonal allergic rhinitis: Secondary | ICD-10-CM | POA: Diagnosis not present

## 2020-05-07 DIAGNOSIS — I10 Essential (primary) hypertension: Secondary | ICD-10-CM | POA: Diagnosis not present

## 2020-05-07 DIAGNOSIS — F1210 Cannabis abuse, uncomplicated: Secondary | ICD-10-CM | POA: Diagnosis not present

## 2020-05-07 DIAGNOSIS — F17210 Nicotine dependence, cigarettes, uncomplicated: Secondary | ICD-10-CM | POA: Diagnosis not present

## 2020-10-16 DIAGNOSIS — F1010 Alcohol abuse, uncomplicated: Secondary | ICD-10-CM | POA: Diagnosis not present

## 2020-10-16 DIAGNOSIS — J302 Other seasonal allergic rhinitis: Secondary | ICD-10-CM | POA: Diagnosis not present

## 2020-10-16 DIAGNOSIS — H7210 Attic perforation of tympanic membrane, unspecified ear: Secondary | ICD-10-CM | POA: Diagnosis not present

## 2020-10-16 DIAGNOSIS — F1210 Cannabis abuse, uncomplicated: Secondary | ICD-10-CM | POA: Diagnosis not present

## 2020-10-16 DIAGNOSIS — I10 Essential (primary) hypertension: Secondary | ICD-10-CM | POA: Diagnosis not present

## 2020-10-16 DIAGNOSIS — F17210 Nicotine dependence, cigarettes, uncomplicated: Secondary | ICD-10-CM | POA: Diagnosis not present

## 2020-10-16 DIAGNOSIS — R062 Wheezing: Secondary | ICD-10-CM | POA: Diagnosis not present

## 2020-10-30 DIAGNOSIS — M109 Gout, unspecified: Secondary | ICD-10-CM | POA: Diagnosis not present

## 2020-10-30 DIAGNOSIS — I10 Essential (primary) hypertension: Secondary | ICD-10-CM | POA: Diagnosis not present

## 2020-10-30 DIAGNOSIS — F1210 Cannabis abuse, uncomplicated: Secondary | ICD-10-CM | POA: Diagnosis not present

## 2020-10-30 DIAGNOSIS — F17210 Nicotine dependence, cigarettes, uncomplicated: Secondary | ICD-10-CM | POA: Diagnosis not present

## 2020-10-30 DIAGNOSIS — F1010 Alcohol abuse, uncomplicated: Secondary | ICD-10-CM | POA: Diagnosis not present

## 2020-10-30 DIAGNOSIS — R634 Abnormal weight loss: Secondary | ICD-10-CM | POA: Diagnosis not present

## 2020-11-03 ENCOUNTER — Emergency Department (HOSPITAL_COMMUNITY)
Admission: EM | Admit: 2020-11-03 | Discharge: 2020-11-03 | Disposition: A | Discharge status: Home / Self Care | Payer: Medicare HMO | Attending: Emergency Medicine | Admitting: Emergency Medicine

## 2020-11-03 ENCOUNTER — Other Ambulatory Visit: Payer: Self-pay

## 2020-11-03 ENCOUNTER — Emergency Department (HOSPITAL_COMMUNITY): Payer: Medicare HMO

## 2020-11-03 DIAGNOSIS — F17210 Nicotine dependence, cigarettes, uncomplicated: Secondary | ICD-10-CM | POA: Diagnosis not present

## 2020-11-03 DIAGNOSIS — S99922A Unspecified injury of left foot, initial encounter: Secondary | ICD-10-CM | POA: Diagnosis not present

## 2020-11-03 DIAGNOSIS — M79675 Pain in left toe(s): Secondary | ICD-10-CM | POA: Diagnosis not present

## 2020-11-03 DIAGNOSIS — W208XXA Other cause of strike by thrown, projected or falling object, initial encounter: Secondary | ICD-10-CM | POA: Insufficient documentation

## 2020-11-03 DIAGNOSIS — M21612 Bunion of left foot: Secondary | ICD-10-CM | POA: Diagnosis not present

## 2020-11-03 DIAGNOSIS — M2012 Hallux valgus (acquired), left foot: Secondary | ICD-10-CM | POA: Diagnosis not present

## 2020-11-03 DIAGNOSIS — S90112A Contusion of left great toe without damage to nail, initial encounter: Secondary | ICD-10-CM | POA: Insufficient documentation

## 2020-11-03 DIAGNOSIS — M19072 Primary osteoarthritis, left ankle and foot: Secondary | ICD-10-CM | POA: Diagnosis not present

## 2020-11-03 NOTE — Discharge Instructions (Addendum)
You were seen in the emergency department today for pain in your left big toe. The x-ray we took showed no fracture or dislocation of your toe. You can take tylenol and ibuprofen as needed for pain. Please return to the emergency department for any reason. You can also follow-up with your primary care doctor if the pain does not resolve. Thank you for trusting [place] with your care.

## 2020-11-03 NOTE — ED Provider Notes (Signed)
[facility] EMERGENCY DEPARTMENT Provider Note   CSN: [id number] Arrival date & time: 11/03/20  2131     History Chief Complaint  Patient presents with   Toe Injury    Bryce West is a 55 y.o. male.  With past medical history of gout who presents emergency department after rolling over in left great toe with his grill.  States that he was moving the grill when he struck his afternoon with it.  He denies hitting any other part of his foot.  Denies hearing any pop or crack.  Denies falling.   HPI  Past Medical History:  Diagnosis Date   Eczema    Gout     There are no problems to display for this patient.   No past surgical history on file.     No family history on file.  Social History   Tobacco Use   Smoking status: Every Day    Packs/day: 1.00    Types: Cigarettes  Substance Use Topics   Alcohol use: Yes   Drug use: Yes    Types: Marijuana    Comment: daily    Home Medications Prior to Admission medications   Medication Sig Start Date End Date Taking? Authorizing Provider  calamine lotion Apply 1 application topically 2 (two) times daily as needed (Eczema on hands).    [provider]  colchicine 0.6 MG tablet 2 tabs po x 1, then one tab po 1 hour later 09/24/14   Fayrene Helper, PA-C  naproxen (NAPROSYN) 500 MG tablet Take 1 tablet (500 mg total) by mouth 2 (two) times daily. 04/12/17   Rancour, Jeannett Senior, MD  naproxen sodium (ANAPROX) 220 MG tablet Take 220 mg by mouth daily as needed (Pain).    [provider]  OVER THE COUNTER MEDICATION Take 1-2 tablets by mouth daily as needed (Pain relief). Generic Pain Relief medication    [provider]  oxyCODONE-acetaminophen (PERCOCET/ROXICET) 5-325 MG per tablet Take 1 tablet by mouth every 6 (six) hours as needed for severe pain. 07/15/14   Piepenbrink, Victorino Dike, PA-C    Allergies    Vicodin [hydrocodone-acetaminophen]  Review of Systems   Review of Systems   Musculoskeletal:  Positive for joint swelling.  All other systems reviewed and are negative.  Physical Exam Updated Vital Signs BP (!) 143/67 (BP Location: Right Arm)   Pulse 88   Temp 98.5 F (36.9 C)   Resp 14   Ht 5\' 11"  (1.803 m)   Wt 63.5 kg   SpO2 97%   BMI 19.53 kg/m   Physical Exam Constitutional:      General: He is not in acute distress.    Appearance: Normal appearance.  Musculoskeletal:        General: Deformity present. Normal range of motion.     Left foot: Deformity and bunion present.     Comments: Small 1cm hematoma present on left great toe pad.   Skin:    General: Skin is warm and dry.     Capillary Refill: Capillary refill takes less than 2 seconds.  Neurological:     General: No focal deficit present.     Mental Status: He is alert and oriented to person, place, and time. Mental status is at baseline.     Sensory: No sensory deficit.     Motor: Motor function is intact.   ED Results / Procedures / Treatments   Labs (all labs ordered are listed, but only abnormal results are displayed)  Labs Reviewed - No data to display  EKG None  Radiology DG Toe Great Left  Result Date: 11/03/2020 CLINICAL DATA:  Hit toe. EXAM: LEFT GREAT TOE COMPARISON:  None. FINDINGS: Advanced degenerative changes of the 1st MTP joint with hallux valgus deformity. No acute bony abnormality. Specifically, no fracture, subluxation, or dislocation. IMPRESSION: Advanced degenerative changes with hallux valgus deformity. No acute bony abnormality. Electronically Signed   By: Charlett Nose M.D.   On: 11/03/2020 22:45    Procedures Procedures   Medications Ordered in ED Medications - No data to display  ED Course  I have reviewed the triage vital signs and the nursing notes.  Pertinent labs & imaging results that were available during my care of the patient were reviewed by me and considered in my medical decision making (see chart for details).    MDM  Rules/Calculators/A&P Bryce West is a 55 year old male who presents emergency department for toe pain.  States that he hit the left great toe on his grill.  On exam there is only a small hematoma present on the sole of the left great toe.  Nail intact.  Sensory neural intact.  Has normal range of motion.  Not currently in any pain. X-ray imaging of the left great toe shows advanced degenerative changes without acute bony abnormality including fracture, dislocation, subluxation..  Patient also has gout in the same digit.  Patient is safe for discharge at this time with over-the-counter pain relief and follow-up with his primary care doctor as needed.  He is agreeable to the plan.  Final Clinical Impression(s) / ED Diagnoses Final diagnoses:  Toe pain, left    Rx / DC Orders ED Discharge Orders     None        Cristopher Peru, PA-C 11/03/20 [id number]    Pricilla Loveless, MD 11/04/20 [phone]

## 2020-11-03 NOTE — ED Provider Notes (Signed)
Emergency Medicine Provider Triage Evaluation Note  Bryce West , a 55 y.o. male  was evaluated in triage.  Pt complains of left toe pain.  He states that this afternoon he accidentally rolled the grill over his left great toe.  He denies any other injuries. Review of Systems  Positive: Left great toe pain Negative: laceration  Physical Exam  BP (!) 143/67 (BP Location: Right Arm)   Pulse 88   Temp 98.5 F (36.9 C)   Resp 14   Ht 5\' 11"  (1.803 m)   Wt 63.5 kg   SpO2 97%   BMI 19.53 kg/m  Gen:   Awake, no distress   Resp:  Normal effort  MSK:   Moves extremities without difficulty  Other:  Questionable swelling of the left great toe.  He is ambulatory with a limp.  No wounds visualized on the left great toe  Medical Decision Making  Medically screening exam initiated at 9:48 PM.  Appropriate orders placed.  CHRISTOPHER GLASSCOCK was informed that the remainder of the evaluation will be completed by another provider, this initial triage assessment does not replace that evaluation, and the importance of remaining in the ED until their evaluation is complete.  Note: Portions of this report may have been transcribed using voice recognition software. Every effort was made to ensure accuracy; however, inadvertent computerized transcription errors may be present    Lincoln Maxin 11/03/20 2150    2151, MD 11/04/20 2127

## 2020-11-03 NOTE — ED Triage Notes (Signed)
Pt states he rolled a grill over his left big toe and is not experiencing pain. Pt also c/o difficulty walking on the toe.

## 2020-11-04 DIAGNOSIS — Z1212 Encounter for screening for malignant neoplasm of rectum: Secondary | ICD-10-CM | POA: Diagnosis not present

## 2020-11-04 DIAGNOSIS — Z1211 Encounter for screening for malignant neoplasm of colon: Secondary | ICD-10-CM | POA: Diagnosis not present

## 2020-11-08 LAB — COLOGUARD: COLOGUARD: NEGATIVE

## 2020-11-27 DIAGNOSIS — F17210 Nicotine dependence, cigarettes, uncomplicated: Secondary | ICD-10-CM | POA: Diagnosis not present

## 2020-11-27 DIAGNOSIS — M109 Gout, unspecified: Secondary | ICD-10-CM | POA: Diagnosis not present

## 2020-11-27 DIAGNOSIS — F1010 Alcohol abuse, uncomplicated: Secondary | ICD-10-CM | POA: Diagnosis not present

## 2020-11-27 DIAGNOSIS — Z6821 Body mass index (BMI) 21.0-21.9, adult: Secondary | ICD-10-CM | POA: Diagnosis not present

## 2020-11-27 DIAGNOSIS — I10 Essential (primary) hypertension: Secondary | ICD-10-CM | POA: Diagnosis not present

## 2020-11-27 DIAGNOSIS — F1210 Cannabis abuse, uncomplicated: Secondary | ICD-10-CM | POA: Diagnosis not present

## 2020-12-01 ENCOUNTER — Other Ambulatory Visit (HOSPITAL_COMMUNITY): Payer: Self-pay | Admitting: Radiology

## 2020-12-01 DIAGNOSIS — J302 Other seasonal allergic rhinitis: Secondary | ICD-10-CM

## 2020-12-01 DIAGNOSIS — F17210 Nicotine dependence, cigarettes, uncomplicated: Secondary | ICD-10-CM

## 2020-12-18 DIAGNOSIS — Z719 Counseling, unspecified: Secondary | ICD-10-CM | POA: Diagnosis not present

## 2020-12-18 DIAGNOSIS — F1210 Cannabis abuse, uncomplicated: Secondary | ICD-10-CM | POA: Diagnosis not present

## 2020-12-18 DIAGNOSIS — Z6822 Body mass index (BMI) 22.0-22.9, adult: Secondary | ICD-10-CM | POA: Diagnosis not present

## 2020-12-18 DIAGNOSIS — J302 Other seasonal allergic rhinitis: Secondary | ICD-10-CM | POA: Diagnosis not present

## 2020-12-18 DIAGNOSIS — F17210 Nicotine dependence, cigarettes, uncomplicated: Secondary | ICD-10-CM | POA: Diagnosis not present

## 2020-12-18 DIAGNOSIS — I10 Essential (primary) hypertension: Secondary | ICD-10-CM | POA: Diagnosis not present

## 2020-12-18 DIAGNOSIS — F1010 Alcohol abuse, uncomplicated: Secondary | ICD-10-CM | POA: Diagnosis not present

## 2021-02-05 DIAGNOSIS — F1010 Alcohol abuse, uncomplicated: Secondary | ICD-10-CM | POA: Diagnosis not present

## 2021-02-05 DIAGNOSIS — M109 Gout, unspecified: Secondary | ICD-10-CM | POA: Diagnosis not present

## 2021-02-05 DIAGNOSIS — F1210 Cannabis abuse, uncomplicated: Secondary | ICD-10-CM | POA: Diagnosis not present

## 2021-02-05 DIAGNOSIS — I10 Essential (primary) hypertension: Secondary | ICD-10-CM | POA: Diagnosis not present

## 2021-03-26 DIAGNOSIS — M109 Gout, unspecified: Secondary | ICD-10-CM | POA: Diagnosis not present

## 2021-03-26 DIAGNOSIS — F1210 Cannabis abuse, uncomplicated: Secondary | ICD-10-CM | POA: Diagnosis not present

## 2021-03-26 DIAGNOSIS — E871 Hypo-osmolality and hyponatremia: Secondary | ICD-10-CM | POA: Diagnosis not present

## 2021-03-26 DIAGNOSIS — I10 Essential (primary) hypertension: Secondary | ICD-10-CM | POA: Diagnosis not present

## 2021-03-26 DIAGNOSIS — F1010 Alcohol abuse, uncomplicated: Secondary | ICD-10-CM | POA: Diagnosis not present

## 2021-06-17 ENCOUNTER — Ambulatory Visit (HOSPITAL_COMMUNITY)
Admission: RE | Admit: 2021-06-17 | Discharge: 2021-06-17 | Disposition: A | Discharge status: Home / Self Care | Payer: Medicare HMO | Source: Ambulatory Visit | Attending: Family Medicine | Admitting: Family Medicine

## 2021-06-17 DIAGNOSIS — F17210 Nicotine dependence, cigarettes, uncomplicated: Secondary | ICD-10-CM | POA: Diagnosis not present

## 2021-06-17 DIAGNOSIS — R0600 Dyspnea, unspecified: Secondary | ICD-10-CM | POA: Diagnosis not present

## 2021-06-17 DIAGNOSIS — J302 Other seasonal allergic rhinitis: Secondary | ICD-10-CM | POA: Insufficient documentation

## 2021-06-17 LAB — PULMONARY FUNCTION TEST
DL/VA % pred: 84 %
DL/VA: 3.61 ml/min/mmHg/L
DLCO unc % pred: 64 %
DLCO unc: 18.71 ml/min/mmHg
FEF 25-75 Post: 1.61 L/sec
FEF 25-75 Pre: 1.37 L/sec
FEF2575-%Change-Post: 17 %
FEF2575-%Pred-Post: 51 %
FEF2575-%Pred-Pre: 43 %
FEV1-%Change-Post: 3 %
FEV1-%Pred-Post: 70 %
FEV1-%Pred-Pre: 67 %
FEV1-Post: 2.33 L
FEV1-Pre: 2.24 L
FEV1FVC-%Change-Post: 2 %
FEV1FVC-%Pred-Pre: 86 %
FEV6-%Change-Post: 1 %
FEV6-%Pred-Post: 80 %
FEV6-%Pred-Pre: 79 %
FEV6-Post: 3.29 L
FEV6-Pre: 3.23 L
FEV6FVC-%Change-Post: 0 %
FEV6FVC-%Pred-Post: 102 %
FEV6FVC-%Pred-Pre: 101 %
FVC-%Change-Post: 0 %
FVC-%Pred-Post: 78 %
FVC-%Pred-Pre: 77 %
FVC-Post: 3.32 L
FVC-Pre: 3.29 L
Post FEV1/FVC ratio: 70 %
Post FEV6/FVC ratio: 99 %
Pre FEV1/FVC ratio: 68 %
Pre FEV6/FVC Ratio: 98 %
RV % pred: 167 %
RV: 3.75 L
TLC % pred: 99 %
TLC: 7.13 L

## 2021-06-17 MED ORDER — ALBUTEROL SULFATE (2.5 MG/3ML) 0.083% IN NEBU
2.5000 mg | INHALATION_SOLUTION | Freq: Once | RESPIRATORY_TRACT | Status: AC
  Administered 2021-06-17: 2.5 mg via RESPIRATORY_TRACT

## 2021-06-25 DIAGNOSIS — R634 Abnormal weight loss: Secondary | ICD-10-CM | POA: Diagnosis not present

## 2021-06-25 DIAGNOSIS — F1210 Cannabis abuse, uncomplicated: Secondary | ICD-10-CM | POA: Diagnosis not present

## 2021-06-25 DIAGNOSIS — F1010 Alcohol abuse, uncomplicated: Secondary | ICD-10-CM | POA: Diagnosis not present

## 2021-06-25 DIAGNOSIS — I10 Essential (primary) hypertension: Secondary | ICD-10-CM | POA: Diagnosis not present

## 2021-06-25 DIAGNOSIS — R062 Wheezing: Secondary | ICD-10-CM | POA: Diagnosis not present

## 2021-06-25 DIAGNOSIS — J441 Chronic obstructive pulmonary disease with (acute) exacerbation: Secondary | ICD-10-CM | POA: Diagnosis not present

## 2021-06-29 IMAGING — CT CT ABD-PELV W/ CM
2 of 5 series · 15 of 46 positions shown, 17 images · IV contrast (iopamidol)
Comparison: None.

CLINICAL DATA: Right upper quadrant pain and right flank pain for 1
week.

EXAM:
CT ABDOMEN AND PELVIS WITH CONTRAST
TECHNIQUE: Multidetector CT imaging of the abdomen and pelvis was performed
using the standard protocol following bolus administration of
intravenous contrast.
CONTRAST:  100mL O8EVQ5-GSS IOPAMIDOL (O8EVQ5-GSS) INJECTION 61%

[Series 2: abd pelvis 5.00 br40 s3 axial · axial · 0.64mm/px · z∈[+1290,+1690]mm · 12 of 90 slices shown, 14 images]
[im 5/90  soft-tissue]
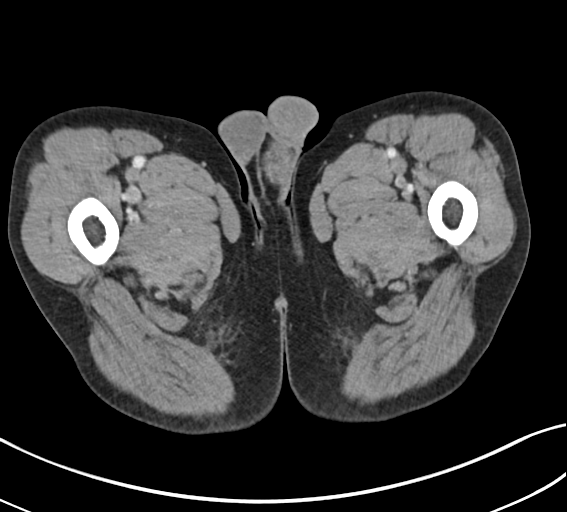
[im 5/90  bone]
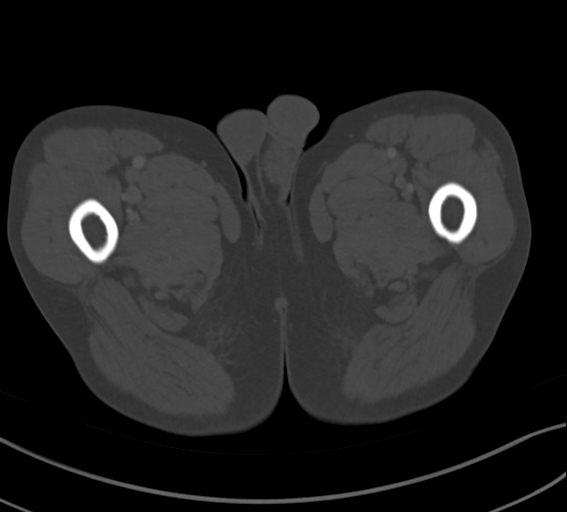
[im 15/90  soft-tissue]
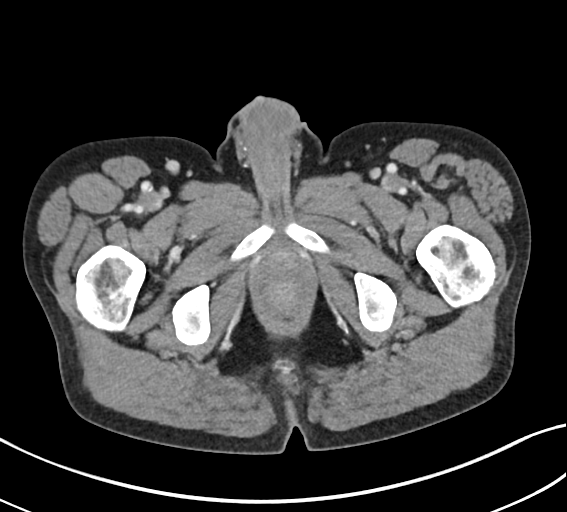
[im 20/90  soft-tissue]
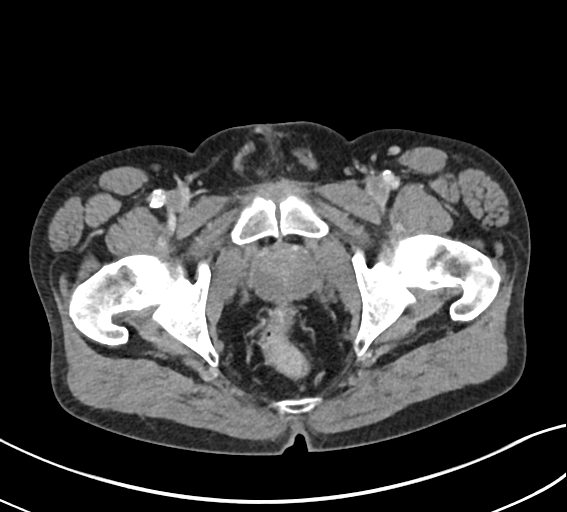
[im 25/90  soft-tissue]
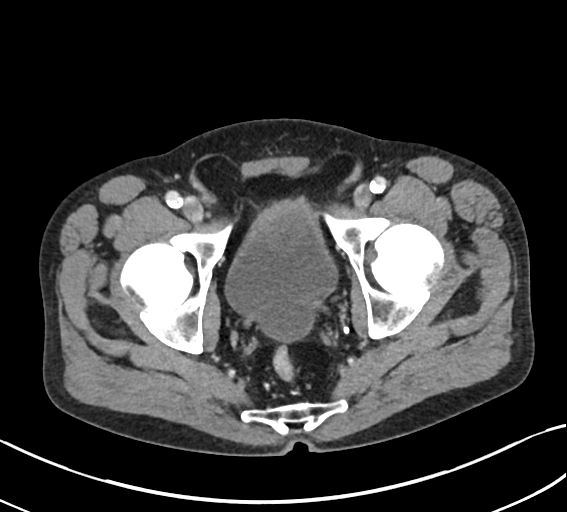
[im 35/90  soft-tissue]
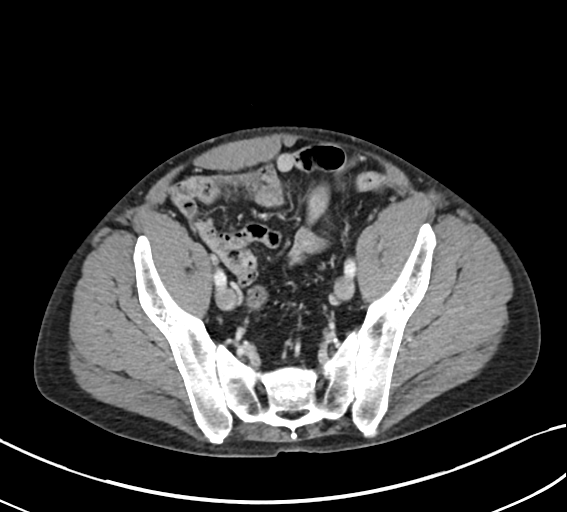
[im 40/90  soft-tissue]
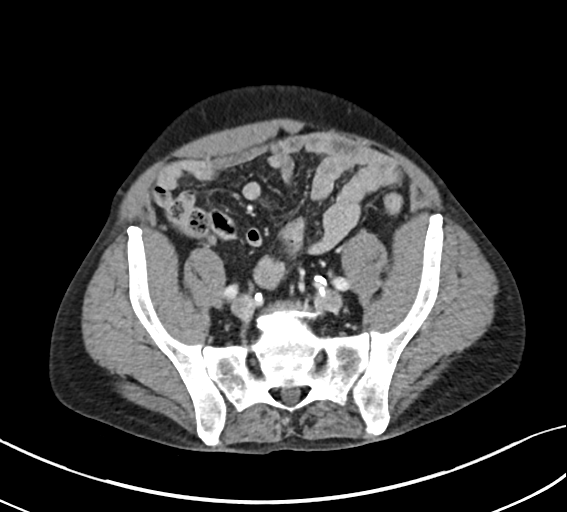
[im 50/90  soft-tissue]
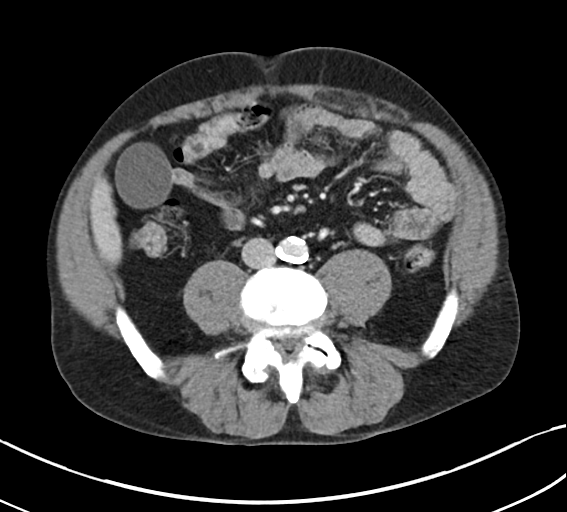
[im 55/90  soft-tissue]
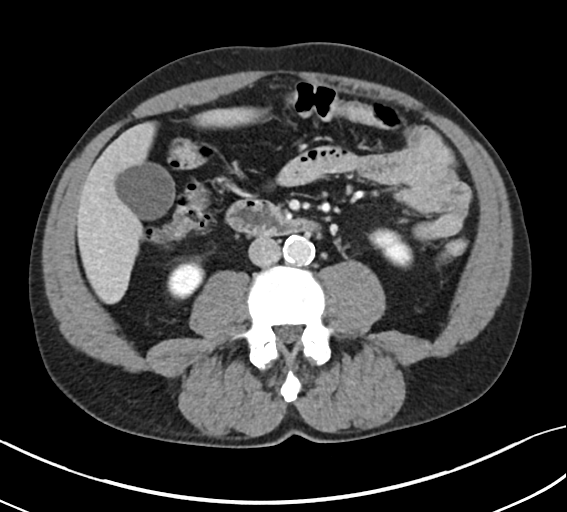
[im 65/90  soft-tissue]
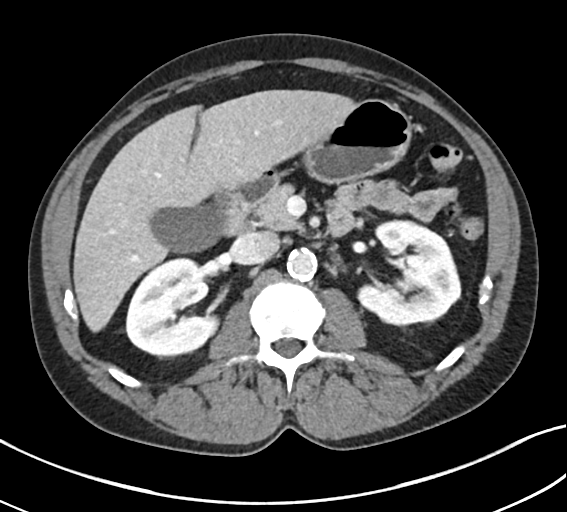
[im 65/90  bone]
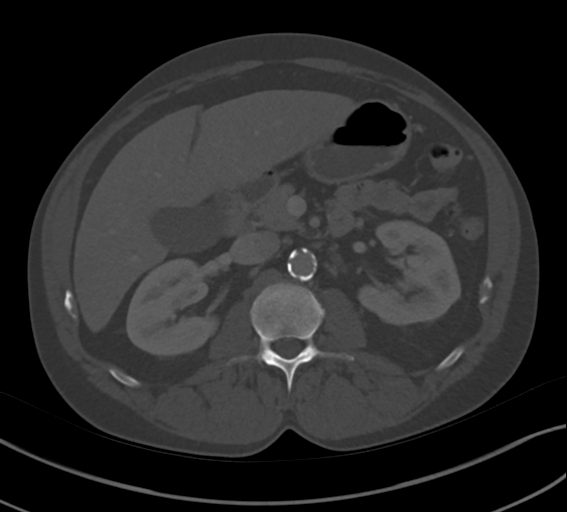
[im 70/90  soft-tissue]
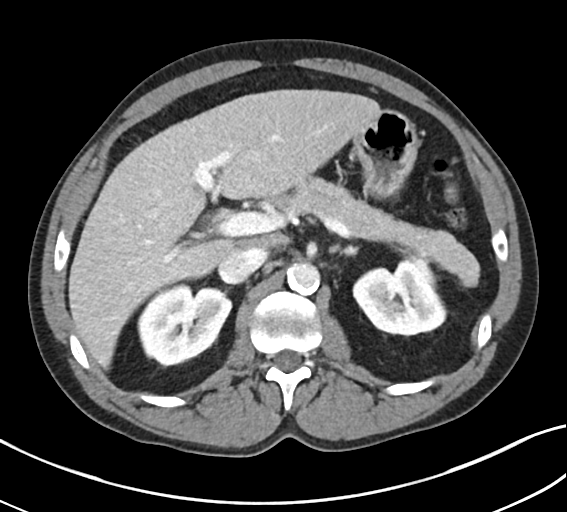
[im 75/90  soft-tissue]
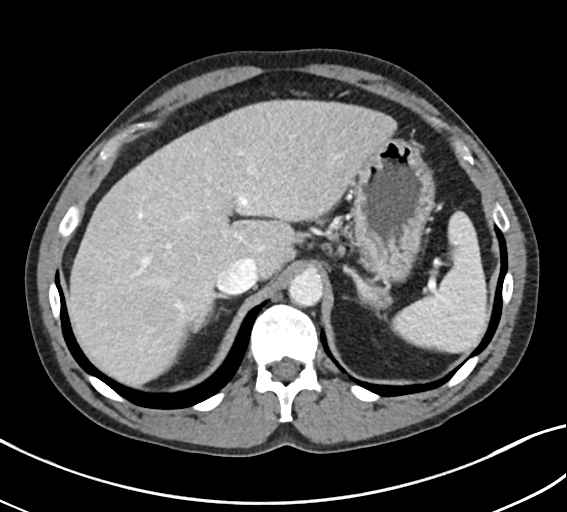
[im 85/90  soft-tissue]
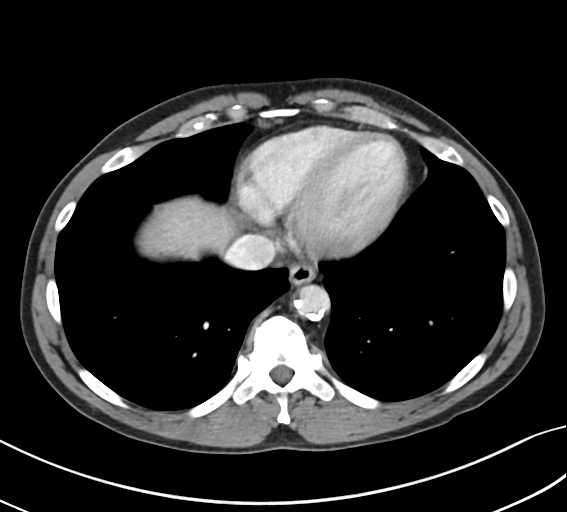

[Series 6: abd pelvis 2.00 br40 s3 cor · coronal · 0.69mm/px · 3 of 136 slices shown]
[im 46/136  soft-tissue]
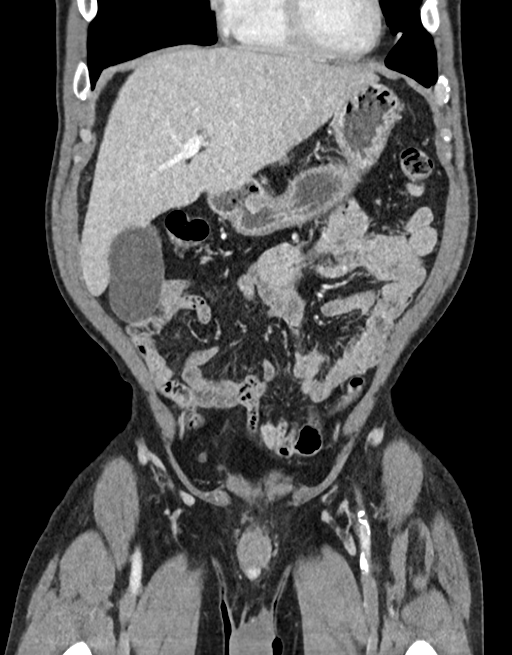
[im 61/136  soft-tissue]
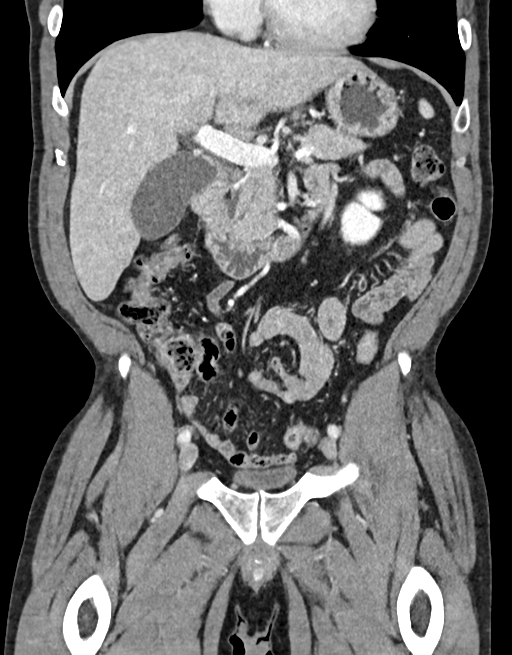
[im 76/136  soft-tissue]
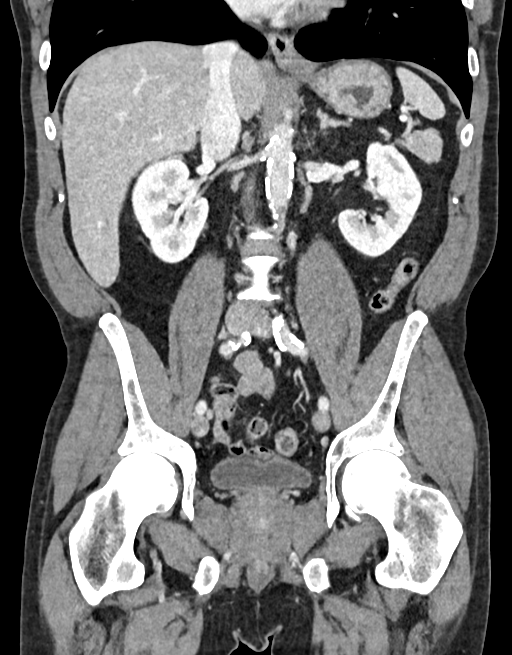

[15 of 46 positions shown; findings below may reference images not displayed]

FINDINGS: Lower Chest: No acute findings.

Hepatobiliary: Tiny cyst is again seen in the anterior liver dome.
No liver masses are identified. Gallbladder is unremarkable. No
evidence of biliary ductal dilatation.

Pancreas:  No mass or inflammatory changes.

Spleen: Within normal limits in size and appearance.

Adrenals/Urinary Tract: No masses identified. No evidence of
ureteral calculi or hydronephrosis. Unremarkable unopacified urinary
bladder.

Stomach/Bowel: No evidence of obstruction, inflammatory process or
abnormal fluid collections. Normal appendix visualized.

Vascular/Lymphatic: No pathologically enlarged lymph nodes. No
abdominal aortic aneurysm. Aortic atherosclerosis.

Reproductive:  No mass or other significant abnormality.

Other:  None.

Musculoskeletal:  No suspicious bone lesions identified.
IMPRESSION: No acute findings or other significant abnormality.

Aortic Atherosclerosis ([id number].I).

## 2021-08-17 ENCOUNTER — Emergency Department (HOSPITAL_COMMUNITY)
Admission: EM | Admit: 2021-08-17 | Discharge: 2021-08-17 | Discharge status: Against Medical Advice | Payer: Medicare HMO | Source: Home / Self Care

## 2021-08-24 ENCOUNTER — Encounter (HOSPITAL_COMMUNITY): Payer: Self-pay | Admitting: Emergency Medicine

## 2021-08-24 ENCOUNTER — Ambulatory Visit (HOSPITAL_COMMUNITY)
Admission: EM | Admit: 2021-08-24 | Discharge: 2021-08-24 | Disposition: A | Discharge status: Home / Self Care | Payer: Medicare HMO | Attending: Physician Assistant | Admitting: Physician Assistant

## 2021-08-24 DIAGNOSIS — S61213A Laceration without foreign body of left middle finger without damage to nail, initial encounter: Secondary | ICD-10-CM

## 2021-08-24 DIAGNOSIS — Z23 Encounter for immunization: Secondary | ICD-10-CM

## 2021-08-24 MED ORDER — TETANUS-DIPHTH-ACELL PERTUSSIS 5-2.5-18.5 LF-MCG/0.5 IM SUSY
PREFILLED_SYRINGE | INTRAMUSCULAR | Status: AC
  Filled 2021-08-24: qty 0.5

## 2021-08-24 MED ORDER — TETANUS-DIPHTH-ACELL PERTUSSIS 5-2.5-18.5 LF-MCG/0.5 IM SUSY
0.5000 mL | PREFILLED_SYRINGE | Freq: Once | INTRAMUSCULAR | Status: AC
  Administered 2021-08-24: 0.5 mL via INTRAMUSCULAR

## 2021-09-08 ENCOUNTER — Ambulatory Visit (HOSPITAL_COMMUNITY)
Admission: EM | Admit: 2021-09-08 | Discharge: 2021-09-08 | Disposition: A | Discharge status: Home / Self Care | Payer: Medicare HMO

## 2021-09-08 NOTE — ED Triage Notes (Signed)
Pt is present for suture removal. PT denies any pain

## 2021-11-04 ENCOUNTER — Encounter (HOSPITAL_COMMUNITY): Payer: Self-pay

## 2021-11-04 ENCOUNTER — Ambulatory Visit (INDEPENDENT_AMBULATORY_CARE_PROVIDER_SITE_OTHER): Payer: Medicare HMO

## 2021-11-04 ENCOUNTER — Ambulatory Visit (HOSPITAL_COMMUNITY)
Admission: EM | Admit: 2021-11-04 | Discharge: 2021-11-04 | Disposition: A | Discharge status: Home / Self Care | Payer: Medicare HMO | Attending: Family Medicine | Admitting: Family Medicine

## 2021-11-04 DIAGNOSIS — M25512 Pain in left shoulder: Secondary | ICD-10-CM

## 2021-11-04 DIAGNOSIS — K0889 Other specified disorders of teeth and supporting structures: Secondary | ICD-10-CM | POA: Diagnosis not present

## 2021-11-04 MED ORDER — PREDNISONE 20 MG PO TABS: 40.0000 mg | ORAL_TABLET | Freq: Every day | ORAL | 0 refills | Status: AC

## 2021-11-04 MED ORDER — AMOXICILLIN 875 MG PO TABS: 875.0000 mg | ORAL_TABLET | Freq: Two times a day (BID) | ORAL | 0 refills | Status: AC
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
## 2022-01-27 ENCOUNTER — Encounter (HOSPITAL_COMMUNITY): Payer: Self-pay

## 2022-01-27 ENCOUNTER — Ambulatory Visit (HOSPITAL_COMMUNITY)
Admission: EM | Admit: 2022-01-27 | Discharge: 2022-01-27 | Disposition: A | Discharge status: Home / Self Care | Payer: Medicare HMO | Attending: Internal Medicine | Admitting: Internal Medicine

## 2022-01-27 DIAGNOSIS — S0181XA Laceration without foreign body of other part of head, initial encounter: Secondary | ICD-10-CM | POA: Diagnosis not present

## 2022-01-27 DIAGNOSIS — W19XXXA Unspecified fall, initial encounter: Secondary | ICD-10-CM | POA: Diagnosis not present

## 2022-01-27 DIAGNOSIS — F1010 Alcohol abuse, uncomplicated: Secondary | ICD-10-CM | POA: Diagnosis not present

## 2022-01-27 DIAGNOSIS — E860 Dehydration: Secondary | ICD-10-CM

## 2022-01-27 MED ORDER — LIDOCAINE-EPINEPHRINE 1 %-1:100000 IJ SOLN
INTRAMUSCULAR | Status: AC
  Filled 2022-01-27: qty 1

## 2022-01-27 NOTE — ED Notes (Signed)
TC to wife for transport home after suture placement.

## 2022-01-27 NOTE — Discharge Instructions (Signed)
Wound care: Please keep the area surrounding the wound/sutures clean and dry for the next 24 hours. After 24 hours, you may get the wound wet. Gently clean wound with antibacterial soap. Do not scrub wound. Cover the area with a nonstick bandage and change the bandage 2 times a day.   You should have the sutures removed in 5 days by your primary care provider or at urgent care. Return sooner than 5 days if you experience discharge from your laceration, redness around your laceration, warmth around your laceration, or fever.

## 2022-01-27 NOTE — ED Provider Notes (Signed)
[facility]    CSN: [id number] Arrival date & time: 01/27/22  1711      History   Chief Complaint Chief Complaint  Patient presents with   Loss of Consciousness   Head Injury    HPI Bryce West is a 56 y.o. male.   Patient presents urgent care for evaluation of fall resulting in syncope that happened this afternoon while he was attempting to fix his heat/thermostat while 4 feet high on a ladder.  Patient lost his balance and fell off the ladder striking his head to the floor.  He is not on any blood thinners and denies headache at this time.  Patient's wife found him on the floor and "woke him up" after what he believes to be approximately 20 to 30 minutes.  Denies nausea and vomiting after fall.  He does state that he is currently somewhat dizzy and has a bit of a headache mostly to the frontal aspect of the head.  Patient admits to heavy alcohol intake since he was "56 years old" and reports he drinks approximately 3 40 ounce beers per day as well as 1-2 pints of liquor "whatever he can get his hands on".  Patient has been drinking today and was under the influence while he was on the ladder.  He believes that this is what caused him to fall.  Speech is somewhat slurred, he reports his speech sounds normal to him.  Denies recent falls, preceding chest pain or shortness of breath, history of diabetes myelitis, and urinary symptoms.  Patient states he feels like he is at his baseline and that he is acting normally.  Patient also smokes both marijuana and cigarettes daily.  Wife drove patient to urgent care for evaluation.  He has a laceration to the right upper eyebrow that initially bled but bleeding has stopped with pressure and Band-Aid.  He currently denies blurry vision, double vision, shortness of breath, chest pain, weakness, and difficulty walking. Patient voices that he wishes to be seen at urgent care and would not like to have to go to the ER to have scans performed  and continues to reports he feels as though this is "his normal".      Past Medical History:  Diagnosis Date   Eczema    Gout     There are no problems to display for this patient.   History reviewed. No pertinent surgical history.     Home Medications    Prior to Admission medications   Medication Sig Start Date End Date Taking? Authorizing Provider  calamine lotion Apply 1 application topically 2 (two) times daily as needed (Eczema on hands).    [provider]  colchicine 0.6 MG tablet 2 tabs po x 1, then one tab po 1 hour later 09/24/14   Fayrene Helper, PA-C  naproxen (NAPROSYN) 500 MG tablet Take 1 tablet (500 mg total) by mouth 2 (two) times daily. 04/12/17   Rancour, Jeannett Senior, MD  naproxen sodium (ANAPROX) 220 MG tablet Take 220 mg by mouth daily as needed (Pain).    [provider]  OVER THE COUNTER MEDICATION Take 1-2 tablets by mouth daily as needed (Pain relief). Generic Pain Relief medication    [provider]  oxyCODONE-acetaminophen (PERCOCET/ROXICET) 5-325 MG per tablet Take 1 tablet by mouth every 6 (six) hours as needed for severe pain. 07/15/14   Piepenbrink, Victorino Dike, PA-C  predniSONE (DELTASONE) 20 MG tablet Take 2 tablets (40 mg total) by mouth  daily. 11/04/21   Mardella Layman, MD    Family History History reviewed. No pertinent family history.  Social History Social History   Tobacco Use   Smoking status: Every Day    Packs/day: 1.00    Types: Cigarettes  Substance Use Topics   Alcohol use: Not Currently    Alcohol/week: 84.0 standard drinks of alcohol    Types: 84 Cans of beer per week    Comment: pt states he drinks about a case of beer a day and a pint of liqour   Drug use: Yes    Types: Marijuana    Comment: daily     Allergies   Vicodin [hydrocodone-acetaminophen]   Review of Systems Review of Systems Per HPI  Physical Exam Triage Vital Signs ED Triage Vitals  Enc Vitals Group     BP 01/27/22 1715 (!)  147/85     Pulse Rate 01/27/22 1715 84     Resp 01/27/22 1715 16     Temp --      Temp src --      SpO2 01/27/22 1715 100 %     Weight --      Height --      Head Circumference --      Peak Flow --      Pain Score 01/27/22 1849 0     Pain Loc --      Pain Edu? --      Excl. in GC? --    No data found.  Updated Vital Signs BP (!) 147/85 (BP Location: Right Arm)   Pulse 84   Resp 16   SpO2 100%   Visual Acuity Right Eye Distance:   Left Eye Distance:   Bilateral Distance:    Right Eye Near:   Left Eye Near:    Bilateral Near:     Physical Exam Vitals and nursing note reviewed.  Constitutional:      Appearance: He is not ill-appearing or toxic-appearing.  HENT:     Head: Normocephalic and atraumatic.     Right Ear: Hearing and external ear normal.     Left Ear: Hearing and external ear normal.     Nose: Nose normal.     Mouth/Throat:     Lips: Pink.  Eyes:     General: Lids are normal. Vision grossly intact. Gaze aligned appropriately. No visual field deficit.    Extraocular Movements: Extraocular movements intact.     Conjunctiva/sclera: Conjunctivae normal.     Right eye: Right conjunctiva is not injected.     Left eye: Left conjunctiva is not injected.     Comments: EOMs intact without dizziness or pain elicited.  Cardiovascular:     Rate and Rhythm: Normal rate and regular rhythm.     Heart sounds: Normal heart sounds, S1 normal and S2 normal.  Pulmonary:     Effort: Pulmonary effort is normal. No respiratory distress.     Breath sounds: Normal breath sounds and air entry. No decreased breath sounds, wheezing, rhonchi or rales.  Musculoskeletal:     Cervical back: Normal and neck supple.     Thoracic back: Normal.     Lumbar back: Normal.     Right lower leg: No edema.     Left lower leg: No edema.  Skin:    General: Skin is warm and dry.     Capillary Refill: Capillary refill takes less than 2 seconds.     Findings: Laceration present. No rash.  Comments: Small laceration to above the right eyebrow.  See image below for details.  Laceration approximately 1.5 cm long.  Neurological:     General: No focal deficit present.     Mental Status: He is alert and oriented to person, place, and time. Mental status is at baseline.     Cranial Nerves: No facial asymmetry.     Sensory: Sensation is intact.     Motor: Motor function is intact. No tremor or seizure activity.     Coordination: Coordination is intact.     Gait: Gait normal.     Comments: Patient is neurologically at baseline despite slight confusion and substance induced delirium.   Psychiatric:        Mood and Affect: Mood normal.        Speech: Speech normal.        Behavior: Behavior normal.        Thought Content: Thought content normal.        Judgment: Judgment normal.      UC Treatments / Results  Labs (all labs ordered are listed, but only abnormal results are displayed) Labs Reviewed - No data to display  EKG   Radiology No results found.  Procedures Laceration Repair  Date/Time: 01/27/2022 7:37 PM  Performed by: Carlisle BeersStanhope, Quentyn Kolbeck M, FNP Authorized by: Carlisle BeersStanhope, Lamar Meter M, FNP   Consent:    Consent obtained:  Verbal   Consent given by:  Patient   Risks, benefits, and alternatives were discussed: yes     Risks discussed:  Infection, pain, retained foreign body, tendon damage, vascular damage, poor wound healing, poor cosmetic result, need for additional repair and nerve damage   Alternatives discussed:  No treatment Universal protocol:    Procedure explained and questions answered to patient or proxy's satisfaction: yes     Patient identity confirmed:  Verbally with patient Anesthesia:    Anesthesia method:  Local infiltration   Local anesthetic:  Lidocaine 1% WITH epi Laceration details:    Location:  Face   Face location:  R eyebrow   Length (cm):  1.5   Depth (mm):  5 Pre-procedure details:    Preparation:  Patient was prepped and draped  in usual sterile fashion Treatment:    Area cleansed with:  Povidone-iodine   Amount of cleaning:  Standard Skin repair:    Repair method:  Sutures   Suture size:  5-0   Suture material:  Prolene   Suture technique:  Simple interrupted   Number of sutures:  3 Approximation:    Approximation:  Close Repair type:    Repair type:  Simple Post-procedure details:    Dressing:  Open (no dressing)   Procedure completion:  Tolerated well, no immediate complications  (including critical care time)  Medications Ordered in UC Medications - No data to display  Initial Impression / Assessment and Plan / UC Course  I have reviewed the triage vital signs and the nursing notes.  Pertinent labs & imaging results that were available during my care of the patient were reviewed by me and considered in my medical decision making (see chart for details).   1.  Injury of head, facial laceration, fall, alcohol abuse Patient appears to be under the influence of alcohol as described in HPI. Patient is a slightly poor historian. Musculoskeletal and neurologic exams are stable. Laceration repaired, see procedure note above for details. Tdap injection was last received a couple of weeks ago therefore does not need to be repeated today.  Patient to return to urgent care in 5-7 days for suture removal, discussed infection precautions. He is to return sooner if he notices signs of infection. Discussed treatment options to help him stop drinking alcohol, he declines this at this time but will follow-up with PCP or urgent care if he desires to stop drinking in the future. Deferred referral to ED today based on stable neurologic findings. Suspect syncope related to alcohol intoxication and dehydration. Patient to go home and drink plenty of water. He does not appear to be dehydrated to physical exam today, is slightly hypertensive, and heart rate is normal. Maintaining oxygen comfortably on room air without shortness of  breath. Strict ER return precautions discussed. Patient's wife to pick him up and drive him home due to alcohol intoxication.   Patient left urgent care without his after visit summary after laceration repair procedure performed.  Discussed physical exam and available lab work findings in clinic with patient.  Counseled patient regarding appropriate use of medications and potential side effects for all medications recommended or prescribed today. Discussed red flag signs and symptoms of worsening condition,when to call the PCP office, return to urgent care, and when to seek higher level of care in the emergency department. Patient verbalizes understanding and agreement with plan. All questions answered. Patient discharged in stable condition.    Final Clinical Impressions(s) / UC Diagnoses   Final diagnoses:  Alcohol abuse  Dehydration  Fall, initial encounter  Facial laceration, initial encounter     Discharge Instructions      Wound care: Please keep the area surrounding the wound/sutures clean and dry for the next 24 hours. After 24 hours, you may get the wound wet. Gently clean wound with antibacterial soap. Do not scrub wound. Cover the area with a nonstick bandage and change the bandage 2 times a day.   You should have the sutures removed in 5 days by your primary care provider or at urgent care. Return sooner than 5 days if you experience discharge from your laceration, redness around your laceration, warmth around your laceration, or fever.      ED Prescriptions   None    PDMP not reviewed this encounter.   Carlisle Beers, [place] 01/31/22 [phone]

## 2022-01-27 NOTE — ED Triage Notes (Addendum)
Patient was inside going up the stairs messing with the heat in his home. He was on the ladder, on the stairs) when he fell off and hit his head on the ladder. Was about 4 ft off the ground.States he passed out. States his wife woke him up, that he was out for 20-30 mins. States his vision is blurry and he has a headache. Patient is dizzy and stumbling.  Speech slightly slurred. Patient seems slightly confused when answering questions. Patient unable to tell the year or the day of the week, unable to say who the president is. Patient states he does drink everyday for years. Unable to say how much he drank today, smoked weed today.  States he drinks beer and liquor. 12 beers/ 3-4 40s and about a pint of liquor in a day. When asked how much liquor he drinks in a day Patient answered, "Whatever I can get my hands on." States he drinks like this everyday.    No nausea or vomiting.   Has a laceration above the right eyebrow.   Patient having slight mid chest pain.

## 2022-03-24 DIAGNOSIS — J441 Chronic obstructive pulmonary disease with (acute) exacerbation: Secondary | ICD-10-CM | POA: Diagnosis not present

## 2022-03-24 DIAGNOSIS — R7303 Prediabetes: Secondary | ICD-10-CM | POA: Diagnosis not present

## 2022-03-24 DIAGNOSIS — E785 Hyperlipidemia, unspecified: Secondary | ICD-10-CM | POA: Diagnosis not present

## 2022-03-24 DIAGNOSIS — F1010 Alcohol abuse, uncomplicated: Secondary | ICD-10-CM | POA: Diagnosis not present

## 2022-03-24 DIAGNOSIS — I10 Essential (primary) hypertension: Secondary | ICD-10-CM | POA: Diagnosis not present

## 2022-03-24 DIAGNOSIS — E559 Vitamin D deficiency, unspecified: Secondary | ICD-10-CM | POA: Diagnosis not present

## 2022-03-24 DIAGNOSIS — Z0000 Encounter for general adult medical examination without abnormal findings: Secondary | ICD-10-CM | POA: Diagnosis not present

## 2022-03-24 DIAGNOSIS — M109 Gout, unspecified: Secondary | ICD-10-CM | POA: Diagnosis not present

## 2022-05-19 DIAGNOSIS — I10 Essential (primary) hypertension: Secondary | ICD-10-CM | POA: Diagnosis not present

## 2022-05-19 DIAGNOSIS — M109 Gout, unspecified: Secondary | ICD-10-CM | POA: Diagnosis not present

## 2022-05-19 DIAGNOSIS — E785 Hyperlipidemia, unspecified: Secondary | ICD-10-CM | POA: Diagnosis not present

## 2022-05-19 DIAGNOSIS — F1010 Alcohol abuse, uncomplicated: Secondary | ICD-10-CM | POA: Diagnosis not present

## 2022-05-19 DIAGNOSIS — J441 Chronic obstructive pulmonary disease with (acute) exacerbation: Secondary | ICD-10-CM | POA: Diagnosis not present

## 2022-06-30 DIAGNOSIS — Z1211 Encounter for screening for malignant neoplasm of colon: Secondary | ICD-10-CM | POA: Diagnosis not present

## 2022-06-30 DIAGNOSIS — K625 Hemorrhage of anus and rectum: Secondary | ICD-10-CM | POA: Diagnosis not present

## 2022-06-30 DIAGNOSIS — R7401 Elevation of levels of liver transaminase levels: Secondary | ICD-10-CM | POA: Diagnosis not present

## 2022-07-21 DIAGNOSIS — R7989 Other specified abnormal findings of blood chemistry: Secondary | ICD-10-CM | POA: Diagnosis not present

## 2022-07-21 DIAGNOSIS — Z7901 Long term (current) use of anticoagulants: Secondary | ICD-10-CM | POA: Diagnosis not present

## 2022-07-21 DIAGNOSIS — K7460 Unspecified cirrhosis of liver: Secondary | ICD-10-CM | POA: Diagnosis not present

## 2022-08-11 DIAGNOSIS — K7460 Unspecified cirrhosis of liver: Secondary | ICD-10-CM | POA: Diagnosis not present

## 2022-08-16 DIAGNOSIS — K625 Hemorrhage of anus and rectum: Secondary | ICD-10-CM | POA: Diagnosis not present

## 2022-08-16 DIAGNOSIS — K635 Polyp of colon: Secondary | ICD-10-CM | POA: Diagnosis not present

## 2022-08-16 DIAGNOSIS — Z1211 Encounter for screening for malignant neoplasm of colon: Secondary | ICD-10-CM | POA: Diagnosis not present

## 2022-08-18 DIAGNOSIS — K51419 Inflammatory polyps of colon with unspecified complications: Secondary | ICD-10-CM | POA: Diagnosis not present

## 2022-08-18 DIAGNOSIS — F10188 Alcohol abuse with other alcohol-induced disorder: Secondary | ICD-10-CM | POA: Diagnosis not present

## 2022-08-18 DIAGNOSIS — F17210 Nicotine dependence, cigarettes, uncomplicated: Secondary | ICD-10-CM | POA: Diagnosis not present

## 2022-08-18 DIAGNOSIS — I10 Essential (primary) hypertension: Secondary | ICD-10-CM | POA: Diagnosis not present

## 2022-08-18 DIAGNOSIS — Z6820 Body mass index (BMI) 20.0-20.9, adult: Secondary | ICD-10-CM | POA: Diagnosis not present

## 2022-08-18 DIAGNOSIS — K700 Alcoholic fatty liver: Secondary | ICD-10-CM | POA: Diagnosis not present

## 2022-08-18 DIAGNOSIS — E782 Mixed hyperlipidemia: Secondary | ICD-10-CM | POA: Diagnosis not present

## 2022-08-18 DIAGNOSIS — R7303 Prediabetes: Secondary | ICD-10-CM | POA: Diagnosis not present

## 2022-08-30 DIAGNOSIS — K635 Polyp of colon: Secondary | ICD-10-CM | POA: Diagnosis not present

## 2022-09-01 DIAGNOSIS — I700 Atherosclerosis of aorta: Secondary | ICD-10-CM | POA: Diagnosis not present

## 2022-09-01 DIAGNOSIS — K828 Other specified diseases of gallbladder: Secondary | ICD-10-CM | POA: Diagnosis not present

## 2022-09-01 DIAGNOSIS — R160 Hepatomegaly, not elsewhere classified: Secondary | ICD-10-CM | POA: Diagnosis not present

## 2022-09-22 DIAGNOSIS — R945 Abnormal results of liver function studies: Secondary | ICD-10-CM | POA: Diagnosis not present

## 2022-09-22 DIAGNOSIS — I10 Essential (primary) hypertension: Secondary | ICD-10-CM | POA: Diagnosis not present

## 2022-10-06 DIAGNOSIS — Z86010 Personal history of colonic polyps: Secondary | ICD-10-CM | POA: Diagnosis not present

## 2022-10-06 DIAGNOSIS — K648 Other hemorrhoids: Secondary | ICD-10-CM | POA: Diagnosis not present

## 2022-11-03 DIAGNOSIS — J302 Other seasonal allergic rhinitis: Secondary | ICD-10-CM | POA: Diagnosis not present

## 2022-11-03 DIAGNOSIS — I10 Essential (primary) hypertension: Secondary | ICD-10-CM | POA: Diagnosis not present

## 2022-12-01 DIAGNOSIS — I10 Essential (primary) hypertension: Secondary | ICD-10-CM | POA: Diagnosis not present

## 2023-03-16 DIAGNOSIS — E782 Mixed hyperlipidemia: Secondary | ICD-10-CM | POA: Diagnosis not present

## 2023-03-16 DIAGNOSIS — K700 Alcoholic fatty liver: Secondary | ICD-10-CM | POA: Diagnosis not present

## 2023-03-16 DIAGNOSIS — Z6821 Body mass index (BMI) 21.0-21.9, adult: Secondary | ICD-10-CM | POA: Diagnosis not present

## 2023-03-16 DIAGNOSIS — M25511 Pain in right shoulder: Secondary | ICD-10-CM | POA: Diagnosis not present

## 2023-03-16 DIAGNOSIS — I10 Essential (primary) hypertension: Secondary | ICD-10-CM | POA: Diagnosis not present

## 2023-03-16 DIAGNOSIS — M109 Gout, unspecified: Secondary | ICD-10-CM | POA: Diagnosis not present

## 2023-03-16 DIAGNOSIS — F1010 Alcohol abuse, uncomplicated: Secondary | ICD-10-CM | POA: Diagnosis not present

## 2023-04-09 IMAGING — DX DG TOE GREAT 2+V*L*
3 series · 3 of 3 positions shown · non-contrast
Comparison: None.

CLINICAL DATA: Hit toe.

EXAM:
LEFT GREAT TOE

[toe ap]
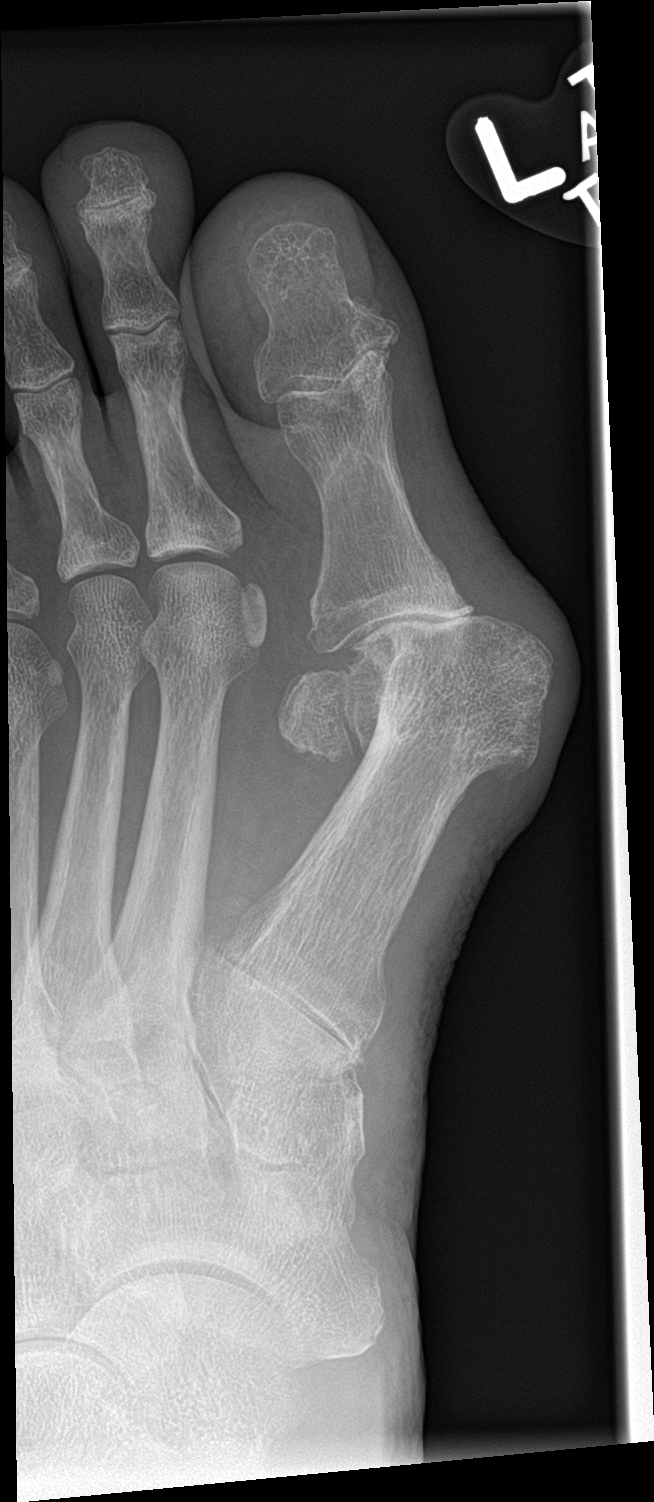

[toe obl]
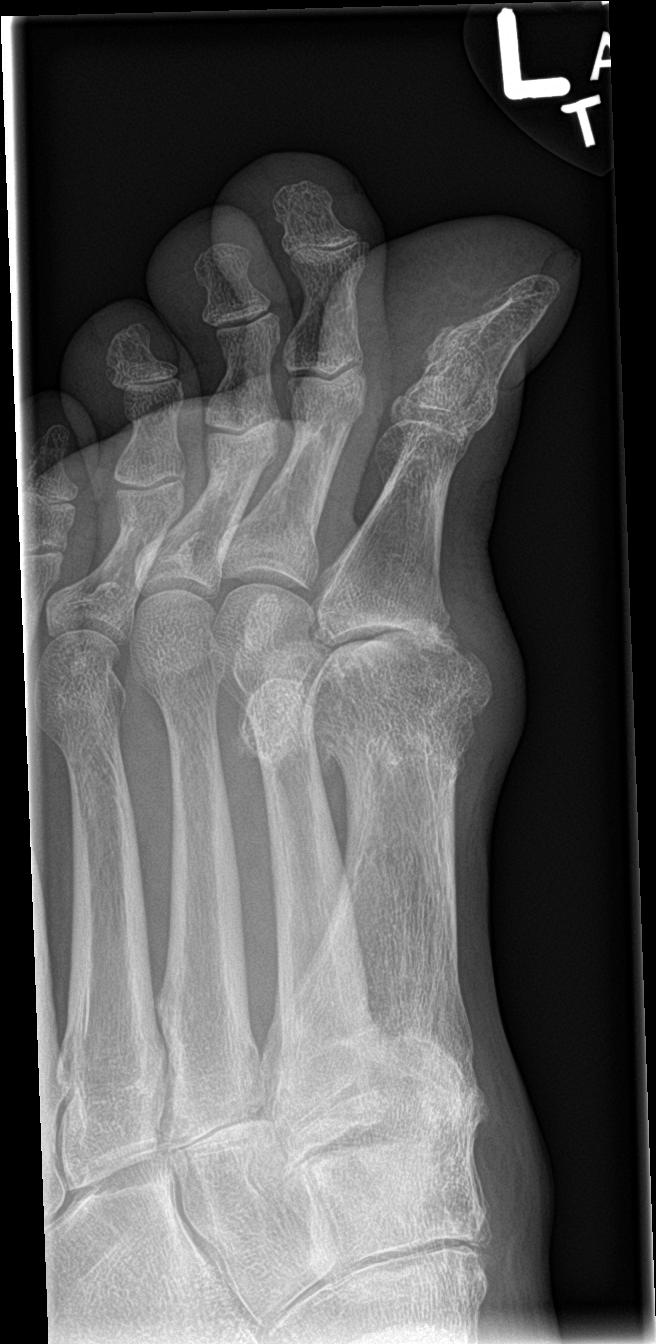

[toe lat]
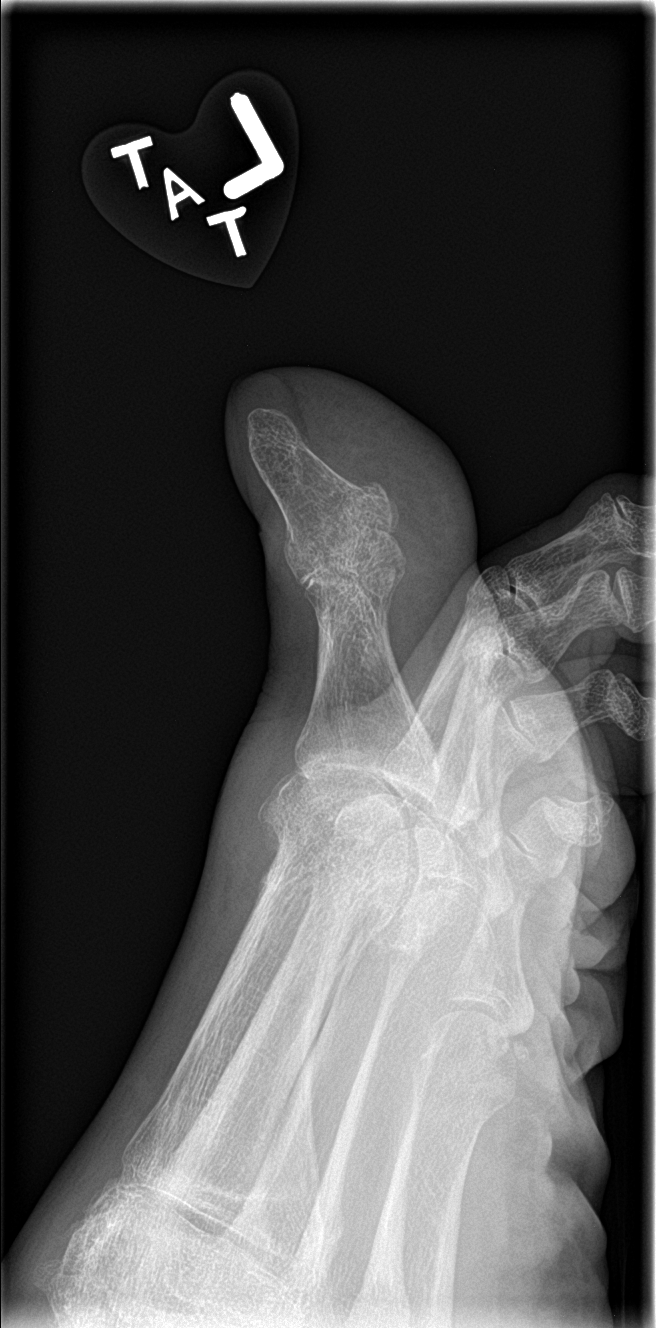

[3 of 3 positions shown; findings below may reference images not displayed]

FINDINGS: Advanced degenerative changes of the 1st MTP joint with hallux
valgus deformity. No acute bony abnormality. Specifically, no
fracture, subluxation, or dislocation.
IMPRESSION: Advanced degenerative changes with hallux valgus deformity. No acute
bony abnormality.

## 2023-04-27 DIAGNOSIS — K7460 Unspecified cirrhosis of liver: Secondary | ICD-10-CM | POA: Diagnosis not present

## 2023-04-27 DIAGNOSIS — K648 Other hemorrhoids: Secondary | ICD-10-CM | POA: Diagnosis not present

## 2023-04-27 DIAGNOSIS — Z7901 Long term (current) use of anticoagulants: Secondary | ICD-10-CM | POA: Diagnosis not present

## 2023-04-27 DIAGNOSIS — R772 Abnormality of alphafetoprotein: Secondary | ICD-10-CM | POA: Diagnosis not present

## 2023-05-16 ENCOUNTER — Other Ambulatory Visit (HOSPITAL_COMMUNITY): Payer: Self-pay

## 2023-05-16 DIAGNOSIS — R772 Abnormality of alphafetoprotein: Secondary | ICD-10-CM

## 2023-05-24 DIAGNOSIS — K648 Other hemorrhoids: Secondary | ICD-10-CM | POA: Diagnosis not present

## 2023-05-24 DIAGNOSIS — Z1211 Encounter for screening for malignant neoplasm of colon: Secondary | ICD-10-CM | POA: Diagnosis not present

## 2023-06-15 DIAGNOSIS — Z131 Encounter for screening for diabetes mellitus: Secondary | ICD-10-CM | POA: Diagnosis not present

## 2023-06-15 DIAGNOSIS — I10 Essential (primary) hypertension: Secondary | ICD-10-CM | POA: Diagnosis not present

## 2023-06-15 DIAGNOSIS — K700 Alcoholic fatty liver: Secondary | ICD-10-CM | POA: Diagnosis not present

## 2023-06-15 DIAGNOSIS — E782 Mixed hyperlipidemia: Secondary | ICD-10-CM | POA: Diagnosis not present

## 2023-06-15 DIAGNOSIS — Z125 Encounter for screening for malignant neoplasm of prostate: Secondary | ICD-10-CM | POA: Diagnosis not present

## 2023-06-15 DIAGNOSIS — Z6821 Body mass index (BMI) 21.0-21.9, adult: Secondary | ICD-10-CM | POA: Diagnosis not present

## 2023-06-15 DIAGNOSIS — M1000 Idiopathic gout, unspecified site: Secondary | ICD-10-CM | POA: Diagnosis not present

## 2023-06-15 DIAGNOSIS — Z0000 Encounter for general adult medical examination without abnormal findings: Secondary | ICD-10-CM | POA: Diagnosis not present

## 2023-06-29 ENCOUNTER — Ambulatory Visit (HOSPITAL_COMMUNITY)

## 2023-07-06 ENCOUNTER — Encounter (HOSPITAL_COMMUNITY): Payer: Self-pay

## 2023-07-06 ENCOUNTER — Ambulatory Visit (HOSPITAL_COMMUNITY)

## 2023-07-20 ENCOUNTER — Ambulatory Visit (HOSPITAL_COMMUNITY)
Admission: RE | Admit: 2023-07-20 | Discharge: 2023-07-20 | Disposition: A | Discharge status: Home / Self Care | Source: Ambulatory Visit

## 2023-07-20 ENCOUNTER — Encounter (HOSPITAL_COMMUNITY): Payer: Self-pay

## 2023-07-20 DIAGNOSIS — R772 Abnormality of alphafetoprotein: Secondary | ICD-10-CM

## 2023-09-14 DIAGNOSIS — Z0000 Encounter for general adult medical examination without abnormal findings: Secondary | ICD-10-CM | POA: Diagnosis not present

## 2023-09-14 DIAGNOSIS — E782 Mixed hyperlipidemia: Secondary | ICD-10-CM | POA: Diagnosis not present

## 2023-09-14 DIAGNOSIS — M109 Gout, unspecified: Secondary | ICD-10-CM | POA: Diagnosis not present

## 2023-09-14 DIAGNOSIS — J45909 Unspecified asthma, uncomplicated: Secondary | ICD-10-CM | POA: Diagnosis not present

## 2023-09-14 DIAGNOSIS — K700 Alcoholic fatty liver: Secondary | ICD-10-CM | POA: Diagnosis not present

## 2023-09-14 DIAGNOSIS — F10188 Alcohol abuse with other alcohol-induced disorder: Secondary | ICD-10-CM | POA: Diagnosis not present

## 2023-09-14 DIAGNOSIS — Z6821 Body mass index (BMI) 21.0-21.9, adult: Secondary | ICD-10-CM | POA: Diagnosis not present

## 2023-09-14 DIAGNOSIS — E559 Vitamin D deficiency, unspecified: Secondary | ICD-10-CM | POA: Diagnosis not present

## 2023-09-14 DIAGNOSIS — I10 Essential (primary) hypertension: Secondary | ICD-10-CM | POA: Diagnosis not present

## 2023-09-14 DIAGNOSIS — K648 Other hemorrhoids: Secondary | ICD-10-CM | POA: Diagnosis not present

## 2023-09-14 DIAGNOSIS — R7303 Prediabetes: Secondary | ICD-10-CM | POA: Diagnosis not present

## 2023-09-14 DIAGNOSIS — Z0001 Encounter for general adult medical examination with abnormal findings: Secondary | ICD-10-CM | POA: Diagnosis not present

## 2023-12-14 DIAGNOSIS — E559 Vitamin D deficiency, unspecified: Secondary | ICD-10-CM | POA: Diagnosis not present

## 2023-12-14 DIAGNOSIS — I10 Essential (primary) hypertension: Secondary | ICD-10-CM | POA: Diagnosis not present

## 2023-12-14 DIAGNOSIS — R7303 Prediabetes: Secondary | ICD-10-CM | POA: Diagnosis not present

## 2023-12-14 DIAGNOSIS — F40240 Claustrophobia: Secondary | ICD-10-CM | POA: Diagnosis not present

## 2023-12-14 DIAGNOSIS — E782 Mixed hyperlipidemia: Secondary | ICD-10-CM | POA: Diagnosis not present

## 2023-12-14 DIAGNOSIS — J45909 Unspecified asthma, uncomplicated: Secondary | ICD-10-CM | POA: Diagnosis not present

## 2023-12-14 DIAGNOSIS — F17210 Nicotine dependence, cigarettes, uncomplicated: Secondary | ICD-10-CM | POA: Diagnosis not present

## 2023-12-14 DIAGNOSIS — M1000 Idiopathic gout, unspecified site: Secondary | ICD-10-CM | POA: Diagnosis not present

## 2024-04-03 ENCOUNTER — Encounter: Payer: Self-pay | Admitting: *Deleted
